# Patient Record
Sex: Female | Born: 1975
Health system: Southern US, Community
[De-identification: ages and names within clinical notes are randomized; demographics above are authoritative.]

## PROBLEM LIST (undated history)

## (undated) ENCOUNTER — Emergency Department (HOSPITAL_COMMUNITY): Admission: EM | Discharge status: Against Medical Advice | Payer: BC Managed Care – PPO

## (undated) DIAGNOSIS — F419 Anxiety disorder, unspecified: Secondary | ICD-10-CM

## (undated) DIAGNOSIS — F32A Depression, unspecified: Secondary | ICD-10-CM

## (undated) DIAGNOSIS — E785 Hyperlipidemia, unspecified: Secondary | ICD-10-CM

## (undated) DIAGNOSIS — D582 Other hemoglobinopathies: Secondary | ICD-10-CM

## (undated) DIAGNOSIS — F329 Major depressive disorder, single episode, unspecified: Secondary | ICD-10-CM

## (undated) HISTORY — PX: PELVIC LAPAROSCOPY: SHX162

## (undated) HISTORY — DX: Major depressive disorder, single episode, unspecified: F32.9

## (undated) HISTORY — DX: Depression, unspecified: F32.A

## (undated) HISTORY — DX: Hyperlipidemia, unspecified: E78.5

## (undated) HISTORY — PX: WISDOM TOOTH EXTRACTION: SHX21

## (undated) HISTORY — DX: Anxiety disorder, unspecified: F41.9

## (undated) HISTORY — DX: Other hemoglobinopathies: D58.2

---

## 1998-01-15 ENCOUNTER — Inpatient Hospital Stay (HOSPITAL_COMMUNITY): Admission: AD | Admit: 1998-01-15 | Discharge: 1998-01-15 | Payer: Self-pay | Admitting: Gynecology

## 1998-01-26 ENCOUNTER — Inpatient Hospital Stay (HOSPITAL_COMMUNITY): Admission: AD | Admit: 1998-01-26 | Discharge: 1998-01-26 | Payer: Self-pay | Admitting: Gynecology

## 1998-01-27 ENCOUNTER — Inpatient Hospital Stay (HOSPITAL_COMMUNITY): Admission: AD | Admit: 1998-01-27 | Discharge: 1998-01-27 | Payer: Self-pay | Admitting: Gynecology

## 1998-03-07 ENCOUNTER — Inpatient Hospital Stay (HOSPITAL_COMMUNITY): Admission: AD | Admit: 1998-03-07 | Discharge: 1998-03-09 | Payer: Self-pay | Admitting: Gynecology

## 1998-08-23 ENCOUNTER — Emergency Department (HOSPITAL_COMMUNITY): Admission: EM | Admit: 1998-08-23 | Discharge: 1998-08-23 | Payer: Self-pay | Admitting: Emergency Medicine

## 2000-07-29 ENCOUNTER — Encounter: Admission: RE | Admit: 2000-07-29 | Discharge: 2000-10-27 | Payer: Self-pay | Admitting: Gynecology

## 2000-09-08 ENCOUNTER — Inpatient Hospital Stay (HOSPITAL_COMMUNITY): Admission: AD | Admit: 2000-09-08 | Discharge: 2000-09-08 | Payer: Self-pay | Admitting: Gynecology

## 2000-11-16 ENCOUNTER — Inpatient Hospital Stay (HOSPITAL_COMMUNITY): Admission: AD | Admit: 2000-11-16 | Discharge: 2000-11-16 | Payer: Self-pay | Admitting: Gynecology

## 2000-11-25 ENCOUNTER — Inpatient Hospital Stay (HOSPITAL_COMMUNITY): Admission: AD | Admit: 2000-11-25 | Discharge: 2000-11-25 | Payer: Self-pay | Admitting: Gynecology

## 2001-01-07 ENCOUNTER — Inpatient Hospital Stay (HOSPITAL_COMMUNITY): Admission: AD | Admit: 2001-01-07 | Discharge: 2001-01-07 | Payer: Self-pay | Admitting: *Deleted

## 2001-01-12 ENCOUNTER — Inpatient Hospital Stay (HOSPITAL_COMMUNITY): Admission: AD | Admit: 2001-01-12 | Discharge: 2001-01-14 | Payer: Self-pay | Admitting: Gynecology

## 2002-08-01 ENCOUNTER — Encounter: Payer: Self-pay | Admitting: Emergency Medicine

## 2002-08-01 ENCOUNTER — Emergency Department (HOSPITAL_COMMUNITY): Admission: EM | Admit: 2002-08-01 | Discharge: 2002-08-01 | Payer: Self-pay | Admitting: Emergency Medicine

## 2003-05-18 ENCOUNTER — Other Ambulatory Visit: Admission: RE | Admit: 2003-05-18 | Discharge: 2003-05-18 | Payer: Self-pay | Admitting: Gynecology

## 2004-02-10 ENCOUNTER — Emergency Department (HOSPITAL_COMMUNITY): Admission: EM | Admit: 2004-02-10 | Discharge: 2004-02-10 | Payer: Self-pay | Admitting: *Deleted

## 2004-03-08 ENCOUNTER — Ambulatory Visit (HOSPITAL_BASED_OUTPATIENT_CLINIC_OR_DEPARTMENT_OTHER): Admission: RE | Admit: 2004-03-08 | Discharge: 2004-03-08 | Payer: Self-pay | Admitting: Gynecology

## 2004-10-03 ENCOUNTER — Other Ambulatory Visit: Admission: RE | Admit: 2004-10-03 | Discharge: 2004-10-03 | Payer: Self-pay | Admitting: Gynecology

## 2004-10-17 ENCOUNTER — Encounter: Admission: RE | Admit: 2004-10-17 | Discharge: 2005-01-15 | Payer: Self-pay | Admitting: Gynecology

## 2006-07-02 ENCOUNTER — Other Ambulatory Visit: Admission: RE | Admit: 2006-07-02 | Discharge: 2006-07-02 | Payer: Self-pay | Admitting: Gynecology

## 2006-08-19 ENCOUNTER — Ambulatory Visit: Payer: Self-pay | Admitting: Hematology & Oncology

## 2007-01-29 ENCOUNTER — Ambulatory Visit: Payer: Self-pay | Admitting: Oncology

## 2007-03-25 ENCOUNTER — Emergency Department (HOSPITAL_COMMUNITY): Admission: EM | Admit: 2007-03-25 | Discharge: 2007-03-25 | Payer: Self-pay | Admitting: Emergency Medicine

## 2007-07-22 ENCOUNTER — Other Ambulatory Visit: Admission: RE | Admit: 2007-07-22 | Discharge: 2007-07-22 | Payer: Self-pay | Admitting: Gynecology

## 2007-08-04 ENCOUNTER — Ambulatory Visit: Payer: Self-pay | Admitting: Hematology and Oncology

## 2007-08-17 LAB — CBC WITH DIFFERENTIAL/PLATELET
Basophils Absolute: 0.1 10*3/uL (ref 0.0–0.1)
EOS%: 1 % (ref 0.0–7.0)
Eosinophils Absolute: 0.1 10*3/uL (ref 0.0–0.5)
HCT: 37.1 % (ref 34.8–46.6)
HGB: 13.1 g/dL (ref 11.6–15.9)
MCH: 30.8 pg (ref 26.0–34.0)
MCV: 87.3 fL (ref 81.0–101.0)
NEUT#: 5.8 10*3/uL (ref 1.5–6.5)
NEUT%: 60.2 % (ref 39.6–76.8)
lymph#: 3.1 10*3/uL (ref 0.9–3.3)

## 2007-08-17 LAB — COMPREHENSIVE METABOLIC PANEL
AST: 13 U/L (ref 0–37)
Albumin: 4.3 g/dL (ref 3.5–5.2)
BUN: 12 mg/dL (ref 6–23)
Calcium: 9.5 mg/dL (ref 8.4–10.5)
Chloride: 107 mEq/L (ref 96–112)
Creatinine, Ser: 0.6 mg/dL (ref 0.40–1.20)
Glucose, Bld: 87 mg/dL (ref 70–99)

## 2007-08-17 LAB — LACTATE DEHYDROGENASE: LDH: 100 U/L (ref 94–250)

## 2007-11-24 ENCOUNTER — Emergency Department (HOSPITAL_COMMUNITY): Admission: EM | Admit: 2007-11-24 | Discharge: 2007-11-24 | Payer: Self-pay | Admitting: Family Medicine

## 2009-07-26 ENCOUNTER — Other Ambulatory Visit: Admission: RE | Admit: 2009-07-26 | Discharge: 2009-07-26 | Payer: Self-pay | Admitting: Gynecology

## 2009-07-26 ENCOUNTER — Encounter: Payer: Self-pay | Admitting: Gynecology

## 2009-07-26 ENCOUNTER — Ambulatory Visit: Payer: Self-pay | Admitting: Gynecology

## 2009-09-06 ENCOUNTER — Ambulatory Visit: Payer: Self-pay | Admitting: Gynecology

## 2010-10-21 ENCOUNTER — Other Ambulatory Visit: Payer: Self-pay | Admitting: Gynecology

## 2010-10-21 ENCOUNTER — Other Ambulatory Visit (HOSPITAL_COMMUNITY)
Admission: RE | Admit: 2010-10-21 | Discharge: 2010-10-21 | Disposition: A | Discharge status: Home / Self Care | Payer: BC Managed Care – PPO | Source: Ambulatory Visit | Attending: Gynecology | Admitting: Gynecology

## 2010-10-21 ENCOUNTER — Ambulatory Visit
Admission: RE | Admit: 2010-10-21 | Discharge: 2010-10-21 | Payer: Self-pay | Source: Home / Self Care | Attending: Gynecology | Admitting: Gynecology

## 2010-10-21 DIAGNOSIS — Z124 Encounter for screening for malignant neoplasm of cervix: Secondary | ICD-10-CM | POA: Insufficient documentation

## 2010-10-21 DIAGNOSIS — R87810 Cervical high risk human papillomavirus (HPV) DNA test positive: Secondary | ICD-10-CM | POA: Insufficient documentation

## 2010-10-28 ENCOUNTER — Other Ambulatory Visit (INDEPENDENT_AMBULATORY_CARE_PROVIDER_SITE_OTHER): Payer: BC Managed Care – PPO

## 2010-10-28 ENCOUNTER — Ambulatory Visit: Payer: BC Managed Care – PPO | Admitting: Gynecology

## 2010-10-28 DIAGNOSIS — N831 Corpus luteum cyst of ovary, unspecified side: Secondary | ICD-10-CM

## 2010-10-28 DIAGNOSIS — E7800 Pure hypercholesterolemia, unspecified: Secondary | ICD-10-CM

## 2010-10-28 DIAGNOSIS — N920 Excessive and frequent menstruation with regular cycle: Secondary | ICD-10-CM

## 2010-10-28 DIAGNOSIS — N946 Dysmenorrhea, unspecified: Secondary | ICD-10-CM

## 2010-11-14 ENCOUNTER — Other Ambulatory Visit: Payer: Self-pay | Admitting: Gynecology

## 2010-11-14 ENCOUNTER — Ambulatory Visit (INDEPENDENT_AMBULATORY_CARE_PROVIDER_SITE_OTHER): Payer: BC Managed Care – PPO | Admitting: Gynecology

## 2010-11-14 DIAGNOSIS — N870 Mild cervical dysplasia: Secondary | ICD-10-CM

## 2010-11-21 ENCOUNTER — Institutional Professional Consult (permissible substitution) (INDEPENDENT_AMBULATORY_CARE_PROVIDER_SITE_OTHER): Payer: BC Managed Care – PPO | Admitting: Gynecology

## 2010-11-21 DIAGNOSIS — N870 Mild cervical dysplasia: Secondary | ICD-10-CM

## 2011-02-07 NOTE — H&P (Signed)
NAME:  Linda Winters, Linda Winters                    ACCOUNT NO.:  [id number]   MEDICAL RECORD NO.:  [id number]                   PATIENT TYPE:  AMB   LOCATION:  [facility]                                 FACILITY:  [facility]   PHYSICIAN:  Juan H. Lily Peer, M.D.             DATE OF BIRTH:  [birth year]   DATE OF ADMISSION:  03/08/2004  DATE OF DISCHARGE:                                HISTORY & PHYSICAL   CHIEF COMPLAINT:  Chronic left lower quadrant pain.   HISTORY:  The patient is a 35 year old gravida 3, para 2, AB 1, who has  continued to complain of left lower quadrant pain.  She is currently on  Ortho Evra as a form of contraception she takes continuously for six weeks  and then withdraws.  She also takes Sarafem 10 mg daily for PMDD.  Review of  her records indicated in 1996 she had a diagnostic laparoscopy and ablation  of endometriotic implants in the area of the cul-de-sac.  Despite her being  on the Ortho Evra patch, she continues to have the pain.  More than likely  she is having flare-ups of endometriosis.  I have given her the option of  proceeding with re-laparoscopy and treating with laser with follow-up as  oral contraceptive procedures, but she has opted to proceed with this.  She  was seen in the office on June 13 for an ultrasound.  The ultrasound  demonstrated a normal-sized uterus with normal ovaries.  The patient will  proceed with a diagnostic laparoscopy this coming Friday.   PAST MEDICAL HISTORY:  She has had two normal spontaneous vaginal  deliveries.  She has had laparoscopy with a diagnosis of endometriosis in  the cul-de-sac.  She has also had the NuvaRing and has been on the Ortho  Evra patch.   Medical history:  She has a history of hemoglobin C trait (husband  hemoglobin S).   FAMILY HISTORY:  Father with noninsulin-dependent diabetes as well as  hypertension.   PHYSICAL EXAMINATION:  VITAL SIGNS:  The patient weighs approximately 170  pounds, 5 feet 7 inches  tall.  Blood pressure 108/80.  HEENT:  Unremarkable.  NECK:  Supple.  Trachea midline.  No carotid bruits.  No thyromegaly.  CHEST:  Lungs clear to auscultation without rhonchi or wheezes.  CARDIAC:  Regular rate and rhythm, no murmur or gallop.  BREASTS:  Exam not done.  ABDOMEN:  Soft, nontender, no rebound or guarding.  PELVIC:  Bartholin's, urethra, Skene's glands within normal limits.  Vagina:  No gross lesion on inspection.  Bimanual exam unremarkable.  Rectal exam  unremarkable.   ASSESSMENT:  A 35 year old gravida 3, para 2, abortus 1, with chronic left  lower quadrant pain, prior history of endometriosis of the cul-de-sac in  1996.  Will proceed with a diagnostic laparoscopy to rule out flare-up of  endometriosis.  Risks and benefits and pros and cons from laparoscopy and  laser were discussed to  include infection, bleeding, trauma to internal  organs requiring open laparotomy and entrance to the abdominal cavity for  repair of any damage or areas unaccessible with the laparoscope, which would  then require her to be in the hospital for an extra few days.  All these  issues were discussed with the patient.  Also, the remote possibility of  hemorrhage; if she were to need any blood or blood products there is a risk  from blood transfusion to include anaphylactic reaction, hepatitis, or  acquired immune deficiency syndrome.  Also there is the risk of deep venous  thrombosis and pulmonary embolism as well as infection, although she will  receive prophylactic antibiotic as well.  The patient was counseled and  accepts all of the above risks.   PLAN:  The patient is scheduled for diagnostic laparoscopy on Friday, June  17, at 9 a.m. at [facility].  Please have history and  physical available.                                               Juan H. Lily Peer, M.D.    JHF/MEDQ  D:  03/05/2004  T:  03/05/2004  Job:  [id number]

## 2011-02-07 NOTE — Op Note (Signed)
NAME:  Linda Winters, Linda Winters                         ACCOUNT NO.:  [id number]   MEDICAL RECORD NO.:  [id number]                   PATIENT TYPE:  AMB   LOCATION:  [facility]                                 FACILITY:  [facility]   PHYSICIAN:  Juan H. Lily Peer, M.D.             DATE OF BIRTH:  [birth year]   DATE OF PROCEDURE:  03/08/2004  DATE OF DISCHARGE:                                 OPERATIVE REPORT   INDICATION FOR OPERATION:  A 35 year old gravida 3, para 2, AB 1 with  chronic lower abdominal pain, mostly in the left lower quadrant.  Patient  with prior history of endometriosis in 1996, treated by laser laparoscopy.   SURGEON:  Juan H. Lily Peer, M.D.   PREOPERATIVE DIAGNOSIS:  Chronic left lower abdominal pains.   POSTOPERATIVE DIAGNOSIS:  Chronic left lower abdominal pains.   ANESTHESIA:  General endotracheal.   PROCEDURE PERFORMED:  Diagnostic laparoscopy.   FINDINGS:  Normal tubes, uterus, and ovaries.  Lush fimbriae of both  fallopian tubes.  Anterior and posterior cul-de-sac with no adhesions or  endometriotic implants.  A small masters window was noted.  Otherwise,  normal cul-de-sac.  The liver surface was smooth, as was part of the  external surface of the gallbladder that was visualized.  The appendix  appeared grossly normal as well.   DESCRIPTION OF OPERATION:  After the patient was adequately counseled, she  was taken to the operating room where she underwent a successful general  endotracheal anesthesia.  She had received 1 g of Cefotan prophylactically.  She was placed in low lithotomy position.  The abdomen and vagina were  prepped and draped in the usual sterile fashion.  A Foley catheter was  introduced to evacuate the bladder of its contents for approximately 50 mL.  Examination under anesthesia demonstrated an anteverted, normal-sized,  shape, and consistently-sized uterus with no palpable adnexal masses.  A  Hulka tenaculum was placed for manipulation during the  laparoscopic  procedure.  Once the drapes were in place, a small stab incision was made in  the infraumbilical region followed by insertion of the Veress needle.  Open  intra-abdominal pressure was recorded at 5 mmHg, and approximately 2.5 L of  carbon dioxide were insufflated into the peritoneal cavity.  The Veress  needle was removed.  The 10 mm trocar was inserted.  The sleeve was left in  place; the trocar was removed, and the laparoscope was inserted.  The second  puncture site was made 2 cm above the symphysis pubis whereby a 5 mm trocar  was inserted under laparoscopic guidance.  In a systematic fashion, the  anterior and posterior cul-de-sac were inspected with no lesions noted with  the exception of a small masters window in the cul-de-sac.  Both tubes were  healthy with lush fimbriated end.  Both ovaries were completely normal in  appearance and in size.  The undersurfaces were inspected as well, and no  evidence of any recurrent endometriosis.  The appendix appeared grossly  normal.  The liver surface was smooth, and part of the gallbladder surface  was visualized and appeared normal as well.  The carbon dioxide was removed  from the peritoneal cavity.  The subumbilical fascia was approximated with a  figure-of-eight of 3-0 Vicryl suture, and the skin was reapproximated  subcuticular stitch of 4-0 plain catgut suture.  The 5 mm trocar site was  reapproximated with interrupted sutures of 4-0 plain catgut suture.  For  postoperative analgesia, 0.25% Marcaine was infiltrated in both incisions  sites for a total of 10 mL.  The Hulka tenaculum was removed.  Silver  nitrate was placed into the anterior cervical lip where it was bleeding a  little bit from the tenaculum.  Once hemostasis was reassured, the patient  was extubated and transferred to the recovery room with stable vital signs.  Blood loss was  minimal, and fluid resuscitation consisted of 80 mL of lactated Ringer's.  The  patient will be referred to gastroenterology after she is seen for a  postop visit in 2 weeks for further management of her lower abdominal pains.                                               Juan H. Lily Peer, M.D.    JHF/MEDQ  D:  03/08/2004  T:  03/08/2004  Job:  [id number]

## 2011-02-07 NOTE — Discharge Summary (Signed)
[facility]  Patient:    Linda Winters, Linda Winters                 MRN: [id number] Adm. Date:  [id number] Disc. Date: [id number] Attending:  Tonye Royalty Dictator:   Antony Contras, N.P.                           Discharge Summary  DISCHARGE DIAGNOSES:          Intrauterine pregnancy at term, history of positive GBS with previous pregnancy, spontaneous onset of labor.  PROCEDURE:                    Normal spontaneous vaginal delivery of viable infant over a midline episiotomy.  HOSPITAL COURSE:              Patient is a 35 year old gravida 3, para 1-0-1-1 with an EDC of January 15, 2001 per sonogram.  Prenatal risk factors include a history of positive beta strep with a previous pregnancy.  PRENATAL LABORATORIES:        Blood type B-.  Antibody screen negative. Sickle cell negative.  RPR, HBSAG, HIV nonreactive.  Rubella immune.  MSAFP normal.  HOSPITAL COURSE:              Patient was admitted on January 12, 2001 with spontaneous onset of labor.  On admission the cervix was 4 cm, 50%, -2. Artificial rupture of membranes revealed clear fluid.  Patient progressed quickly to complete dilatation and delivered an Apgar 8/9 female weighing 8 pounds 12 ounces over a midline episiotomy.  Postpartum course:  She remained afebrile.  Had no difficulty voiding and was able to be discharged in satisfactory condition on her second postpartum day.  The baby was found to be B+ and RhoGAM was administered prior to discharge.  LABORATORIES:                 CBC:  Hematocrit 37.1, hemoglobin 12.9, WBC 20.1, platelets 249,000.  DISPOSITION:                  Follow-up in six weeks.  Continue with prenatal vitamins and iron.  Motrin and Tylox for pain. DD:  02/01/01 TD:  02/01/01 Job: [id number]

## 2011-06-11 ENCOUNTER — Encounter: Payer: Self-pay | Admitting: Anesthesiology

## 2011-06-16 LAB — INFLUENZA A AND B ANTIGEN (CONVERTED LAB)
Inflenza A Ag: NEGATIVE
Influenza B Ag: NEGATIVE

## 2011-06-17 ENCOUNTER — Encounter: Payer: Self-pay | Admitting: Gynecology

## 2011-06-17 ENCOUNTER — Other Ambulatory Visit (HOSPITAL_COMMUNITY)
Admission: RE | Admit: 2011-06-17 | Discharge: 2011-06-17 | Disposition: A | Discharge status: Home / Self Care | Payer: BC Managed Care – PPO | Source: Ambulatory Visit | Attending: Gynecology | Admitting: Gynecology

## 2011-06-17 ENCOUNTER — Ambulatory Visit (INDEPENDENT_AMBULATORY_CARE_PROVIDER_SITE_OTHER): Payer: BC Managed Care – PPO | Admitting: Gynecology

## 2011-06-17 DIAGNOSIS — R87619 Unspecified abnormal cytological findings in specimens from cervix uteri: Secondary | ICD-10-CM

## 2011-06-17 DIAGNOSIS — N809 Endometriosis, unspecified: Secondary | ICD-10-CM

## 2011-06-17 DIAGNOSIS — N870 Mild cervical dysplasia: Secondary | ICD-10-CM

## 2011-06-17 DIAGNOSIS — Z01419 Encounter for gynecological examination (general) (routine) without abnormal findings: Secondary | ICD-10-CM | POA: Insufficient documentation

## 2011-06-17 NOTE — Progress Notes (Signed)
Patient is a 35 year old gravida 3 para 2 AB 1 using condoms for contraception presented to the office for her followup Pap smear. Patient had been seen on 12/01/2010. Patient had an abnormal Pap smear time of her annual exam in January 13 which demonstrated low-grade squamous intraepithelial lesion with high-risk HPV. Patient's previous Pap smear had all been normal in the past. Patient monogamous relationship. Patient underwent colposcopic evaluation on  February 23rd 2012 whereby CIN-1 was noted at the 11:00 position of the ectocervix and had benign ECC. The new guidelines by the American Society  ofColposcopy and CervicalPathology were discussed with the patient and recommended expectant management of women such as herself with biopsy confirming CIN-1 with full visualization of the lesion and expected compliance for followup was given the option of HPV testing at 12 months or a repeat cytology in 6-12 months. She decided to followup with her Pap smear 6 months. Her Pap smear was repeated today and she scheduled to return back in 6 months for a full annual gynecological examination.

## 2011-06-17 NOTE — Patient Instructions (Signed)
Need complete annual exam in six months. Congratulations on our smoking cessation! I knew you could do it.

## 2011-07-07 ENCOUNTER — Telehealth: Payer: Self-pay | Admitting: *Deleted

## 2011-07-07 NOTE — Telephone Encounter (Signed)
Pt called wanting recent pap results, results given to pt.

## 2011-07-08 LAB — POCT CARDIAC MARKERS
CKMB, poc: <1 — ABNORMAL LOW
CKMB, poc: <1 — ABNORMAL LOW
Myoglobin, poc: 43.7
Operator id: 234111
Operator id: 270651
Troponin i, poc: <0.05

## 2011-07-08 LAB — I-STAT 8, (EC8 V) (CONVERTED LAB)
Bicarbonate: 25 — ABNORMAL HIGH
Glucose, Bld: 86
Hemoglobin: 14.6
Operator id: 234111
Sodium: 139
TCO2: 26

## 2011-07-08 LAB — DIFFERENTIAL
Eosinophils Relative: 1
Lymphocytes Relative: 22
Lymphs Abs: 3.3
Monocytes Absolute: 0.8 — ABNORMAL HIGH

## 2011-07-08 LAB — CBC
Hemoglobin: 13.4
MCHC: 33.9
Platelets: 321
RDW: 13

## 2011-07-08 LAB — POCT I-STAT CREATININE: Operator id: 234111

## 2012-01-08 ENCOUNTER — Encounter: Payer: BC Managed Care – PPO | Admitting: Gynecology

## 2012-01-15 ENCOUNTER — Telehealth: Payer: Self-pay | Admitting: *Deleted

## 2012-01-15 NOTE — Telephone Encounter (Signed)
Pt called c/o yeast infection requesting rx, spoke with pt and told her OV best, pt will check schedule and call back to schedule.

## 2012-03-31 ENCOUNTER — Encounter: Payer: BC Managed Care – PPO | Admitting: Gynecology

## 2012-06-08 ENCOUNTER — Encounter: Payer: Self-pay | Admitting: Gynecology

## 2012-06-08 ENCOUNTER — Other Ambulatory Visit (HOSPITAL_COMMUNITY)
Admission: RE | Admit: 2012-06-08 | Discharge: 2012-06-08 | Disposition: A | Discharge status: Home / Self Care | Payer: BC Managed Care – PPO | Source: Ambulatory Visit | Attending: Gynecology | Admitting: Gynecology

## 2012-06-08 ENCOUNTER — Ambulatory Visit (INDEPENDENT_AMBULATORY_CARE_PROVIDER_SITE_OTHER): Payer: BC Managed Care – PPO | Admitting: Gynecology

## 2012-06-08 VITALS — BP 126/80 | Ht 67.0 in | Wt 217.0 lb

## 2012-06-08 DIAGNOSIS — Z833 Family history of diabetes mellitus: Secondary | ICD-10-CM

## 2012-06-08 DIAGNOSIS — Z01419 Encounter for gynecological examination (general) (routine) without abnormal findings: Secondary | ICD-10-CM | POA: Insufficient documentation

## 2012-06-08 DIAGNOSIS — R635 Abnormal weight gain: Secondary | ICD-10-CM

## 2012-06-08 DIAGNOSIS — Z23 Encounter for immunization: Secondary | ICD-10-CM

## 2012-06-08 DIAGNOSIS — N951 Menopausal and female climacteric states: Secondary | ICD-10-CM

## 2012-06-08 DIAGNOSIS — R232 Flushing: Secondary | ICD-10-CM

## 2012-06-08 LAB — CBC WITH DIFFERENTIAL/PLATELET
Basophils Absolute: 0 10*3/uL (ref 0.0–0.1)
Basophils Relative: 0 % (ref 0–1)
Eosinophils Absolute: 0.1 10*3/uL (ref 0.0–0.7)
Lymphs Abs: 4.4 10*3/uL — ABNORMAL HIGH (ref 0.7–4.0)
MCH: 29 pg (ref 26.0–34.0)
MCHC: 35 g/dL (ref 30.0–36.0)
Neutrophils Relative %: 59 % (ref 43–77)
Platelets: 369 10*3/uL (ref 150–400)
RBC: 4.62 MIL/uL (ref 3.87–5.11)
RDW: 13.5 % (ref 11.5–15.5)

## 2012-06-08 NOTE — Progress Notes (Signed)
Linda Winters Dec 09, 1975 [id number]   History:    36 y.o.  for annual gyn exam who was only complaint is bilateral breast tenderness on the outer quadrant for the past several weeks. Patient denies any recent injury. Patient denies any nipple discharge. No family history of breast cancer reported. Patient is not using any form of contraception except withdrawal. Review of her record again she was weighing 198 is up to 217. She does not recall when she ever had the dTap Vaccine. She occasionally will complaints of hot flashes. Her father was non-insulin-dependent diabetic. Patient has stopped smoking for the past year.  Patient had been seen on 12/01/2010. Patient had an abnormal Pap smear time of her annual exam in January 13 which demonstrated low-grade squamous intraepithelial lesion with high-risk HPV. Patient's previous Pap smear had all been normal in the past. Patient monogamous relationship. Patient underwent colposcopic evaluation on February 23rd 2012 whereby CIN-1 was noted at the 11:00 position of the ectocervix and had benign ECC. The new guidelines by the American Society ofColposcopy and CervicalPathology were discussed with the patient and recommended expectant management of women such as herself with biopsy confirming CIN-1 with full visualization of the lesion and expected compliance for followup was given the option of HPV testing at 12 months or a repeat cytology in 6-12 months. She decided to followup with her Pap smear.    Past medical history,surgical history, family history and social history were all reviewed and documented in the EPIC chart.  Gynecologic History Patient's last menstrual period was 05/13/2012. Contraception: Withdrawal Last Pap: 2012. Results were: normal Last mammogram: No prior study. Results were: No prior study  Obstetric History OB History    Grav Para Term Preterm Abortions TAB SAB Ect Mult Living   3 2 2  1  1   2      # Outc Date GA Lbr Len/2nd Wgt  Sex Del Anes PTL Lv   1 TRM     F SVD  No Yes   2 TRM     M SVD  No Yes   3 SAB                ROS: A ROS was performed and pertinent positives and negatives are included in the history.  GENERAL: No fevers or chills. HEENT: No change in vision, no earache, sore throat or sinus congestion. NECK: No pain or stiffness. CARDIOVASCULAR: No chest pain or pressure. No palpitations. PULMONARY: No shortness of breath, cough or wheeze. GASTROINTESTINAL: No abdominal pain, nausea, vomiting or diarrhea, melena or bright red blood per rectum. GENITOURINARY: No urinary frequency, urgency, hesitancy or dysuria. MUSCULOSKELETAL: No joint or muscle pain, no back pain, no recent trauma. DERMATOLOGIC: No rash, no itching, no lesions. ENDOCRINE: No polyuria, polydipsia, no heat or cold intolerance. No recent change in weight. HEMATOLOGICAL: No anemia or easy bruising or bleeding. NEUROLOGIC: No headache, seizures, numbness, tingling or weakness. PSYCHIATRIC: No depression, no loss of interest in normal activity or change in sleep pattern.     Exam: chaperone present  BP 126/80  Ht 5\' 7"  (1.702 m)  Wt 217 lb (98.431 kg)  BMI 33.99 kg/m2  LMP 05/13/2012  Body mass index is 33.99 kg/(m^2).  General appearance : Well developed well nourished female. No acute distress HEENT: Neck supple, trachea midline, no carotid bruits, no thyroidmegaly Lungs: Clear to auscultation, no rhonchi or wheezes, or rib retractions  Heart: Regular rate and rhythm, no murmurs or gallops Breast:Examined in sitting  and supine position were symmetrical in appearance, no palpable masses or tenderness,  no skin retraction, no nipple inversion, no nipple discharge, no skin discoloration, no axillary or supraclavicular lymphadenopathy Abdomen: no palpable masses or tenderness, no rebound or guarding Extremities: no edema or skin discoloration or tenderness  Pelvic:  Bartholin, Urethra, Skene Glands: Within normal limits              Vagina: No gross lesions or discharge  Cervix: No gross lesions or discharge  Uterus  anteverted, normal size, shape and consistency, non-tender and mobile  Adnexa  Without masses or tenderness  Anus and perineum  normal   Rectovaginal  normal sphincter tone without palpated masses or tenderness             Hemoccult not done     Assessment/Plan:  36 y.o. female for annual exam with complaint of bilateral mastodynia in the upper-outer quadrant both breasts. Her examination no palpable masses or tenderness were elicited. Because of her weight and to rule out any possibility of some lesion deeper than was not palpated she will be sent for bilateral diagnostic mammogram. The following labs were ordered today: TSH, FSH, CBC, screening cholesterol, urinalysis, hemoglobin A1c and Pap smear. Literature information on diet and exercise was provided. Patient was to receive the dTap Vaccine today.    Ok Edwards MD, 3:25 PM 06/08/2012

## 2012-06-08 NOTE — Patient Instructions (Addendum)
Cholesterol Control Diet  Cholesterol levels in your body are determined significantly by your diet. Cholesterol levels may also be related to heart disease. The following material helps to explain this relationship and discusses what you can do to help keep your heart healthy. Not all cholesterol is bad. Low-density lipoprotein (LDL) cholesterol is the "bad" cholesterol. It may cause fatty deposits to build up inside your arteries. High-density lipoprotein (HDL) cholesterol is "good." It helps to remove the "bad" LDL cholesterol from your blood. Cholesterol is a very important risk factor for heart disease. Other risk factors are high blood pressure, smoking, stress, heredity, and weight. The heart muscle gets its supply of blood through the coronary arteries. If your LDL cholesterol is high and your HDL cholesterol is low, you are at risk for having fatty deposits build up in your coronary arteries. This leaves less room through which blood can flow. Without sufficient blood and oxygen, the heart muscle cannot function properly and you may feel chest pains (angina pectoris). When a coronary artery closes up entirely, a part of the heart muscle may die, causing a heart attack (myocardial infarction). CHECKING CHOLESTEROL When your caregiver sends your blood to a lab to be analyzed for cholesterol, a complete lipid (fat) profile may be done. With this test, the total amount of cholesterol and levels of LDL and HDL are determined. Triglycerides are a type of fat that circulates in the blood and can also be used to determine heart disease risk. The list below describes what the numbers should be: Test: Total Cholesterol.  Less than 200 mg/dl.  Test: LDL "bad cholesterol."  Less than 100 mg/dl.   Less than 70 mg/dl if you are at very high risk of a heart attack or sudden cardiac death.  Test: HDL "good cholesterol."  Greater than 50 mg/dl for women.    Greater than 40 mg/dl for men.  Test: Triglycerides.  Less than 150 mg/dl.  CONTROLLING CHOLESTEROL WITH DIET Although exercise and lifestyle factors are important, your diet is key. That is because certain foods are known to raise cholesterol and others to lower it. The goal is to balance foods for their effect on cholesterol and more importantly, to replace saturated and trans fat with other types of fat, such as monounsaturated fat, polyunsaturated fat, and omega-3 fatty acids. On average, a person should consume no more than 15 to 17 g of saturated fat daily. Saturated and trans fats are considered "bad" fats, and they will raise LDL cholesterol. Saturated fats are primarily found in animal products such as meats, butter, and cream. However, that does not mean you need to sacrifice all your favorite foods. Today, there are good tasting, low-fat, low-cholesterol substitutes for most of the things you like to eat. Choose low-fat or nonfat alternatives. Choose round or loin cuts of red meat, since these types of cuts are lowest in fat and cholesterol. Chicken (without the skin), fish, veal, and ground [place] breast are excellent choices. Eliminate fatty meats, such as hot dogs and salami. Even shellfish have little or no saturated fat. Have a 3 oz (85 g) portion when you eat lean meat, poultry, or fish. Trans fats are also called "partially hydrogenated oils." They are oils that have been scientifically manipulated so that they are solid at room temperature resulting in a longer shelf life and improved taste and texture of foods in which they are added. Trans fats are found in stick margarine, some tub margarines, cookies, crackers, and baked goods.  When baking and cooking, oils are an excellent substitute for butter. The monounsaturated oils are especially beneficial since it is believed they lower LDL and raise HDL. The oils you should avoid entirely are saturated tropical oils, such as coconut and  palm.  Remember to eat liberally from food groups that are naturally free of saturated and trans fat, including fish, fruit, vegetables, beans, grains (barley, rice, couscous, bulgur wheat), and pasta (without cream sauces).  IDENTIFYING FOODS THAT LOWER CHOLESTEROL  Soluble fiber may lower your cholesterol. This type of fiber is found in fruits such as apples, vegetables such as broccoli, potatoes, and carrots, legumes such as beans, peas, and lentils, and grains such as barley. Foods fortified with plant sterols (phytosterol) may also lower cholesterol. You should eat at least 2 g per day of these foods for a cholesterol lowering effect.  Read package labels to identify low-saturated fats, trans fats free, and low-fat foods at the supermarket. Select cheeses that have only 2 to 3 g saturated fat per ounce. Use a heart-healthy tub margarine that is free of trans fats or partially hydrogenated oil. When buying baked goods (cookies, crackers), avoid partially hydrogenated oils. Breads and muffins should be made from whole grains (whole-wheat or whole oat flour, instead of "flour" or "enriched flour"). Buy non-creamy canned soups with reduced salt and no added fats.  FOOD PREPARATION TECHNIQUES  Never deep-fry. If you must fry, either stir-fry, which uses very little fat, or use non-stick cooking sprays. When possible, broil, bake, or roast meats, and steam vegetables. Instead of dressing vegetables with butter or margarine, use lemon and herbs, applesauce and cinnamon (for squash and sweet potatoes), nonfat yogurt, salsa, and low-fat dressings for salads.  LOW-SATURATED FAT / LOW-FAT FOOD SUBSTITUTES Meats / Saturated Fat (g)  Avoid: Steak, marbled (3 oz/85 g) / 11 g   Choose: Steak, lean (3 oz/85 g) / 4 g   Avoid: Hamburger (3 oz/85 g) / 7 g   Choose: Hamburger, lean (3 oz/85 g) / 5 g   Avoid: Ham (3 oz/85 g) / 6 g   Choose: Ham, lean cut (3 oz/85 g) / 2.4 g   Avoid: Chicken, with skin, dark  meat (3 oz/85 g) / 4 g   Choose: Chicken, skin removed, dark meat (3 oz/85 g) / 2 g   Avoid: Chicken, with skin, light meat (3 oz/85 g) / 2.5 g   Choose: Chicken, skin removed, light meat (3 oz/85 g) / 1 g  Dairy / Saturated Fat (g)  Avoid: Whole milk (1 cup) / 5 g   Choose: Low-fat milk, 2% (1 cup) / 3 g   Choose: Low-fat milk, 1% (1 cup) / 1.5 g   Choose: Skim milk (1 cup) / 0.3 g   Avoid: Hard cheese (1 oz/28 g) / 6 g   Choose: Skim milk cheese (1 oz/28 g) / 2 to 3 g   Avoid: Cottage cheese, 4% fat (1 cup) / 6.5 g   Choose: Low-fat cottage cheese, 1% fat (1 cup) / 1.5 g   Avoid: Ice cream (1 cup) / 9 g   Choose: Sherbet (1 cup) / 2.5 g   Choose: Nonfat frozen yogurt (1 cup) / 0.3 g   Choose: Frozen fruit bar / trace   Avoid: Whipped cream (1 tbs) / 3.5 g   Choose: Nondairy whipped topping (1 tbs) / 1 g  Condiments / Saturated Fat (g)  Avoid: Mayonnaise (1 tbs) / 2 g   Choose: Low-fat  mayonnaise (1 tbs) / 1 g   Avoid: Butter (1 tbs) / 7 g   Choose: Extra light margarine (1 tbs) / 1 g   Avoid: Coconut oil (1 tbs) / 11.8 g   Choose: Olive oil (1 tbs) / 1.8 g   Choose: Corn oil (1 tbs) / 1.7 g   Choose: Safflower oil (1 tbs) / 1.2 g   Choose: Sunflower oil (1 tbs) / 1.4 g   Choose: Soybean oil (1 tbs) / 2.4 g   Choose: Canola oil (1 tbs) / 1 g  Document Released: 09/08/2005 Document Revised: 05/21/2011 Document Reviewed: 02/27/2011 [facility] Patient Information 2012 [place], [place].  Exercise to Lose Weight Exercise and a healthy diet may help you lose weight. Your doctor may suggest specific exercises. EXERCISE IDEAS AND TIPS  Choose low-cost things you enjoy doing, such as walking, bicycling, or exercising to workout videos.   Take stairs instead of the elevator.   Walk during your lunch break.   Park your car further away from work or school.   Go to a gym or an exercise class.   Start with 5 to 10 minutes of exercise each day. Build up to  30 minutes of exercise 4 to 6 days a week.   Wear shoes with good support and comfortable clothes.   Stretch before and after working out.   Work out until you breathe harder and your heart beats faster.   Drink extra water when you exercise.   Do not do so much that you hurt yourself, feel dizzy, or get very short of breath.  Exercises that burn about 150 calories:  Running 1  miles in 15 minutes.   Playing volleyball for 45 to 60 minutes.   Washing and waxing a car for 45 to 60 minutes.   Playing touch football for 45 minutes.   Walking 1  miles in 35 minutes.   Pushing a stroller 1  miles in 30 minutes.   Playing basketball for 30 minutes.   Raking leaves for 30 minutes.   Bicycling 5 miles in 30 minutes.   Walking 2 miles in 30 minutes.   Dancing for 30 minutes.   Shoveling snow for 15 minutes.   Swimming laps for 20 minutes.   Walking up stairs for 15 minutes.   Bicycling 4 miles in 15 minutes.   Gardening for 30 to 45 minutes.   Jumping rope for 15 minutes.   Washing windows or floors for 45 to 60 minutes.  Document Released: 10/11/2010 Document Revised: 05/21/2011 Document Reviewed: 10/11/2010 [facility] Patient Information 2012 Exit Care, [facility].   Diphtheria, Tetanus, and Pertussis (DTaP) Vaccine What You Need to Know WHY GET VACCINATED? Diphtheria, tetanus, and pertussis are serious diseases caused by bacteria. Diphtheria and pertussis are spread from person to person. Tetanus enters the body through cuts or wounds. Diphtheria causes a thick covering in the back of the throat.  It can lead to breathing problems, paralysis, heart failure, and even death.  Tetanus (Lockjaw) causes painful tightening of the muscles, usually all over the body.  It can lead to "locking" of the jaw so the victim cannot open his or her mouth or swallow. Tetanus leads to death in about 2 out of 10 cases.  Pertussis (Whooping Cough) causes coughing spells so bad that it  is hard for infants to eat, drink, or breathe. These spells can last for weeks.  It can lead to pneumonia, seizures (jerking and staring spells), brain damage, and death.  Diphtheria, tetanus, and pertussis vaccine (DTaP) can help prevent these diseases. Most children who are vaccinated with DTaP will be protected throughout childhood. Many more children would get these diseases if we stopped vaccinating. DTaP is a safer version of an older vaccine called DTP. DTP is no longer used in the [place]. WHO SHOULD GET DTAP VACCINE AND WHEN? Children should get 5 doses of DTaP vaccine, 1 dose at each of the following ages:  2 months.   4 months.   6 months.   15 to 18 months.   4 to 6 years.  DTaP may be given at the same time as other vaccines. SOME CHILDREN SHOULD NOT GET DTAP VACCINE OR SHOULD WAIT  Children with minor illnesses, such as a cold, may be vaccinated. But children who are moderately or severely ill should usually wait until they recover before getting DTaP vaccine.   Any child who had a life-threatening allergic reaction after a dose of DTaP should not get another dose.   Any child who suffered a brain or nervous system disease within 7 days after a dose of DTaP should not get another dose.   Talk with your caregiver if your child:   Had a seizure or collapsed after a dose of DTaP.   Cried non-stop for 3 hours or more after a dose of DTaP.   Had a fever over 105 F (40.6 C) after a dose of DTaP.   Ask your caregiver for more information. Some of these children should not get another dose of pertussis vaccine, but may get a vaccine without pertussis, called DT.  OLDER CHILDREN AND ADULTS  DTaP is not licensed for adolescents, adults, or children 104 years of age and older.   But older people still need protection. A vaccine called Tdap is similar to DTaP. A single dose of Tdap is recommended for people 11 through 35 years of age. Another vaccine, called Td,  protects against tetanus and diphtheria, but not pertussis. It is recommended every 10 years.  WHAT ARE THE RISKS FROM DTAP VACCINE?  Getting diphtheria, tetanus, or pertussis disease is much riskier than getting DTaP vaccine.   However, a vaccine, like any medicine, is capable of causing serious problems, such as severe allergic reactions. The risk of DTaP vaccine causing serious harm, or death, is extremely small.  Mild Problems (Common)  Fever (up to about 1 child in 4).   Redness or swelling where the shot was given (up to about 1 child in 4).   Soreness or tenderness where the shot was given (up to about 1 child in 4).  These problems occur more often after the 4th and 5th doses of the DTaP series than after earlier doses. Sometimes the 4th or 5th dose of DTaP vaccine is followed by swelling of the entire arm or leg in which the shot was given, lasting 1 to 7 days (up to about 1 child in 30). Other mild problems include:  Fussiness (up to about 1 child in 3).   Tiredness or poor appetite (up to about 1 child in 10).   Vomiting (up to about 1 child in 50).  These problems generally occur 1 to 3 days after the shot. Moderate Problems (Uncommon)  Seizure (jerking or staring) (about 1 child out of 14,000).   Non-stop crying, for 3 hours or more (up to about 1 child out of 1,000).   High fever, over 105 F (40.6 C) (about 1 child out of 16,000).  Severe Problems (Very Rare)  Serious allergic reaction (less than 1 out of a million doses).   Several other severe problems have been reported after DTaP vaccine. These include:   Long-term seizures, coma, or lowered consciousness.   Permanent brain damage.  These are so rare it is hard to tell if they are caused by the vaccine. Controlling fever is especially important for children who have had seizures, for any reason. It is also important if another family member has had seizures. You can reduce fever and pain by giving your child  an aspirin-free pain reliever when the shot is given, and for the next 24 hours, following the package instructions. WHAT IF THERE IS A MODERATE OR SEVERE REACTION? What should I look for? Any unusual conditions, such as a serious allergic reaction, high fever, or unusual behavior. Serious allergic reactions are extremely rare with any vaccine. If one were to occur, it would most likely be within a few minutes to a few hours after the shot. Signs can include difficulty breathing, hoarseness or wheezing, hives, paleness, weakness, a fast heartbeat, or dizziness. If a high fever or seizure were to occur, it would usually be within a week after the shot. What should I do?  Call your caregiver or get the person to a caregiver right away.   Tell the caregiver what happened, the date and time it happened, and when the vaccination was given.   Ask the caregiver, nurse, or health department to file a Vaccine Adverse Event Reporting System (VAERS) form. Or, you can file this report through the VAERS website at www.vaers.LAgents.no or by calling 1-410-041-9318.  VAERS does not provide medical advice. THE NATIONAL VACCINE INJURY COMPENSATION PROGRAM  In the rare event that you or your child has a serious reaction to a vaccine, a federal program has been created to help you pay for the care of those who have been harmed.   For details about the National Vaccine Injury Compensation Program, call 587-081-7318 or visit the program's website at SpiritualWord.at  HOW CAN I LEARN MORE?  Ask your caregiver. They can give you the vaccine package insert or suggest other sources of information.   Call your local or state health department's immunization program.   Contact the Centers for Disease Control and Prevention (CDC):   Call 309 692 8170 (1-800-CDC-INFO).   Visit the [employer] at PicCapture.uy  CDC Diphtheria, Tetanus, and Pertussis (DTaP) Vaccine  VIS (02/05/06) Document Released: 07/06/2006 Document Revised: 08/28/2011 Document Reviewed: 07/06/2006 [facility] Patient Information 2012 [place], [place].

## 2012-06-09 ENCOUNTER — Encounter: Payer: Self-pay | Admitting: Gynecology

## 2012-06-09 LAB — URINALYSIS W MICROSCOPIC + REFLEX CULTURE
Bacteria, UA: NONE SEEN
Bilirubin Urine: NEGATIVE
Crystals: NONE SEEN
Glucose, UA: NEGATIVE mg/dL
Ketones, ur: NEGATIVE mg/dL
Specific Gravity, Urine: 1.025 (ref 1.005–1.030)
Squamous Epithelial / LPF: NONE SEEN
pH: 6.5 (ref 5.0–8.0)

## 2012-06-09 LAB — TSH: TSH: 1.338 u[IU]/mL (ref 0.350–4.500)

## 2012-06-10 ENCOUNTER — Telehealth: Payer: Self-pay | Admitting: *Deleted

## 2012-06-10 DIAGNOSIS — E7800 Pure hypercholesterolemia, unspecified: Secondary | ICD-10-CM

## 2012-06-10 DIAGNOSIS — R7989 Other specified abnormal findings of blood chemistry: Secondary | ICD-10-CM

## 2012-06-10 DIAGNOSIS — N390 Urinary tract infection, site not specified: Secondary | ICD-10-CM

## 2012-06-10 DIAGNOSIS — R7309 Other abnormal glucose: Secondary | ICD-10-CM

## 2012-06-10 LAB — URINE CULTURE: Colony Count: 9000

## 2012-06-10 NOTE — Telephone Encounter (Signed)
Orders placed pt will call back to make lab appointment.

## 2012-06-10 NOTE — Telephone Encounter (Signed)
Message copied by Aura Camps on Thu Jun 10, 2012 10:26 AM ------      Message from: Ok Edwards      Created: Thu Jun 10, 2012  8:38 AM       Just wanted to make sure you get my previous message since my computer locked. The knee no thank you JF      ----- Message -----         From: [facility]         Sent: 06/08/2012   9:24 PM           To: Ok Edwards, MD

## 2012-06-14 ENCOUNTER — Encounter: Payer: Self-pay | Admitting: Gynecology

## 2012-06-16 ENCOUNTER — Encounter: Payer: Self-pay | Admitting: Gynecology

## 2012-06-18 ENCOUNTER — Other Ambulatory Visit: Payer: BC Managed Care – PPO

## 2012-06-18 ENCOUNTER — Telehealth: Payer: Self-pay | Admitting: *Deleted

## 2012-06-18 DIAGNOSIS — N644 Mastodynia: Secondary | ICD-10-CM

## 2012-06-18 DIAGNOSIS — E7800 Pure hypercholesterolemia, unspecified: Secondary | ICD-10-CM

## 2012-06-18 DIAGNOSIS — R7989 Other specified abnormal findings of blood chemistry: Secondary | ICD-10-CM

## 2012-06-18 DIAGNOSIS — R7309 Other abnormal glucose: Secondary | ICD-10-CM

## 2012-06-18 DIAGNOSIS — N390 Urinary tract infection, site not specified: Secondary | ICD-10-CM

## 2012-06-18 LAB — CBC WITH DIFFERENTIAL/PLATELET
Eosinophils Absolute: 0.2 10*3/uL (ref 0.0–0.7)
Eosinophils Relative: 1 % (ref 0–5)
HCT: 38.1 % (ref 36.0–46.0)
Lymphocytes Relative: 30 % (ref 12–46)
Lymphs Abs: 3.9 10*3/uL (ref 0.7–4.0)
MCH: 28.8 pg (ref 26.0–34.0)
MCV: 84.3 fL (ref 78.0–100.0)
Monocytes Absolute: 0.6 10*3/uL (ref 0.1–1.0)
Platelets: 358 10*3/uL (ref 150–400)
RBC: 4.52 MIL/uL (ref 3.87–5.11)
RDW: 13.8 % (ref 11.5–15.5)

## 2012-06-18 LAB — LIPID PANEL
HDL: 41 mg/dL (ref >39)
LDL Cholesterol: 112 mg/dL — ABNORMAL HIGH (ref 0–99)
Triglycerides: 148 mg/dL (ref <150)
VLDL: 30 mg/dL (ref 0–40)

## 2012-06-18 NOTE — Telephone Encounter (Signed)
Per office note 06/08/12 pt should have bil. Diag. Mammo. Scheduled. Order placed.

## 2012-06-19 LAB — URINALYSIS W MICROSCOPIC + REFLEX CULTURE
Bacteria, UA: NONE SEEN
Ketones, ur: NEGATIVE mg/dL
Leukocytes, UA: NEGATIVE
Nitrite: NEGATIVE
Protein, ur: NEGATIVE mg/dL
Urobilinogen, UA: 0.2 mg/dL (ref 0.0–1.0)
pH: 6 (ref 5.0–8.0)

## 2012-06-21 ENCOUNTER — Telehealth: Payer: Self-pay | Admitting: Gynecology

## 2012-06-21 MED ORDER — AZITHROMYCIN 250 MG PO TABS: ORAL_TABLET | ORAL | Status: DC

## 2012-06-21 NOTE — Telephone Encounter (Signed)
Telephone conversation with Linda Winters in reference her recent lab work. She was in the office for her annual exam on September 17 and her white blood count was 13.3 she was asked to return to the office to repeat it along with a fasting lipid profile since her screening cholesterol was elevated at 210. Her white blood count was down to 12.7. On questioning she states that she has had postnasal drip and sinus congestion for the past 2 weeks which she did not inform me of when she was here in the office. I'm going to call her in the Z-Pak and asked her to buy over-the-counter Mucinex and to come by the office at the end of next week to repeat her CBC. Her lipid profile and look good total cholesterol triglycerides HDL VLDL and cholesterol/HDL ratio were normal but her LDL was slightly elevated 161. We discussed importance of low fat diet and regular exercise.

## 2012-06-28 NOTE — Telephone Encounter (Signed)
Spoke with pt regarding breast center appointment, pt will call breast center a speak with them.

## 2012-06-30 NOTE — Telephone Encounter (Signed)
Appointment 07/01/12 @ 8:20 am

## 2012-07-01 ENCOUNTER — Ambulatory Visit
Admission: RE | Admit: 2012-07-01 | Discharge: 2012-07-01 | Disposition: A | Discharge status: Home / Self Care | Payer: BC Managed Care – PPO | Source: Ambulatory Visit | Attending: Gynecology | Admitting: Gynecology

## 2012-07-01 ENCOUNTER — Other Ambulatory Visit: Payer: Self-pay | Admitting: Gynecology

## 2012-07-01 DIAGNOSIS — N644 Mastodynia: Secondary | ICD-10-CM

## 2012-11-06 ENCOUNTER — Other Ambulatory Visit: Payer: Self-pay

## 2012-12-15 ENCOUNTER — Other Ambulatory Visit: Payer: Self-pay | Admitting: *Deleted

## 2012-12-15 DIAGNOSIS — N63 Unspecified lump in unspecified breast: Secondary | ICD-10-CM

## 2013-07-28 ENCOUNTER — Other Ambulatory Visit: Payer: Self-pay

## 2013-08-10 ENCOUNTER — Encounter: Payer: Self-pay | Admitting: Gynecology

## 2013-08-10 ENCOUNTER — Ambulatory Visit (INDEPENDENT_AMBULATORY_CARE_PROVIDER_SITE_OTHER): Payer: BC Managed Care – PPO | Admitting: Gynecology

## 2013-08-10 ENCOUNTER — Other Ambulatory Visit (HOSPITAL_COMMUNITY)
Admission: RE | Admit: 2013-08-10 | Discharge: 2013-08-10 | Disposition: A | Discharge status: Home / Self Care | Payer: BC Managed Care – PPO | Source: Ambulatory Visit | Attending: Gynecology | Admitting: Gynecology

## 2013-08-10 VITALS — BP 126/78 | Ht 67.0 in | Wt 219.0 lb

## 2013-08-10 DIAGNOSIS — Z01419 Encounter for gynecological examination (general) (routine) without abnormal findings: Secondary | ICD-10-CM | POA: Insufficient documentation

## 2013-08-10 DIAGNOSIS — R635 Abnormal weight gain: Secondary | ICD-10-CM

## 2013-08-10 DIAGNOSIS — Z1151 Encounter for screening for human papillomavirus (HPV): Secondary | ICD-10-CM | POA: Insufficient documentation

## 2013-08-10 DIAGNOSIS — Z87410 Personal history of cervical dysplasia: Secondary | ICD-10-CM

## 2013-08-10 DIAGNOSIS — N912 Amenorrhea, unspecified: Secondary | ICD-10-CM

## 2013-08-10 DIAGNOSIS — N951 Menopausal and female climacteric states: Secondary | ICD-10-CM

## 2013-08-10 DIAGNOSIS — Z833 Family history of diabetes mellitus: Secondary | ICD-10-CM

## 2013-08-10 DIAGNOSIS — Z23 Encounter for immunization: Secondary | ICD-10-CM

## 2013-08-10 LAB — CBC WITH DIFFERENTIAL/PLATELET
Basophils Relative: 0 % (ref 0–1)
Eosinophils Relative: 1 % (ref 0–5)
HCT: 37.3 % (ref 36.0–46.0)
Hemoglobin: 13 g/dL (ref 12.0–15.0)
Lymphocytes Relative: 32 % (ref 12–46)
MCHC: 34.9 g/dL (ref 30.0–36.0)
MCV: 82.5 fL (ref 78.0–100.0)
Monocytes Absolute: 0.7 10*3/uL (ref 0.1–1.0)
Monocytes Relative: 6 % (ref 3–12)
Neutro Abs: 7 10*3/uL (ref 1.7–7.7)
RDW: 14.3 % (ref 11.5–15.5)

## 2013-08-10 LAB — PREGNANCY, URINE: Preg Test, Ur: NEGATIVE

## 2013-08-10 LAB — FOLLICLE STIMULATING HORMONE: FSH: 6.5 m[IU]/mL

## 2013-08-10 LAB — TSH: TSH: 1.481 u[IU]/mL (ref 0.350–4.500)

## 2013-08-10 LAB — COMPREHENSIVE METABOLIC PANEL
Albumin: 4.4 g/dL (ref 3.5–5.2)
BUN: 11 mg/dL (ref 6–23)
CO2: 26 mEq/L (ref 19–32)
Glucose, Bld: 88 mg/dL (ref 70–99)
Potassium: 3.9 mEq/L (ref 3.5–5.3)
Sodium: 138 mEq/L (ref 135–145)
Total Protein: 7.6 g/dL (ref 6.0–8.3)

## 2013-08-10 LAB — PROLACTIN: Prolactin: 7.1 ng/mL

## 2013-08-10 MED ORDER — MEDROXYPROGESTERONE ACETATE 10 MG PO TABS: 10.0000 mg | ORAL_TABLET | Freq: Every day | ORAL | Status: DC

## 2013-08-10 NOTE — Progress Notes (Signed)
Linda Winters 04-30-1976 [id number]   History:    37 y.o.  for annual gyn exam with a complaint of no menses since into September. Patient has been using withdrawal as a form of contraception. Patient had no menses in October and no menses in November. A few days ago said she had some mild nausea and vomited. Urine pregnancy test today in the office was negative. She denies any visual disturbances or nipple discharge or unusual headaches. She received a Tdap vaccine last year. She occasionally complains of some hot flashes. She has gained weight since last year partially 7 pounds.  Patient had been seen on 12/01/2010. Patient had an abnormal Pap smear time of her annual exam in January 13 which demonstrated low-grade squamous intraepithelial lesion with high-risk HPV. Patient's previous Pap smear had all been normal in the past. Patient monogamous relationship. Patient underwent colposcopic evaluation on February 23rd 2012 whereby CIN-1 was noted at the 11:00 position of the ectocervix and had benign ECC. The new guidelines by the American Society ofColposcopy and CervicalPathology were discussed with the patient and recommended expectant management of women such as herself with biopsy confirming CIN-1 with full visualization of the lesion and expected compliance for followup was given the option of HPV testing at 12 months or a repeat cytology in 6-12 months. She decided to followup with her Pap smear which she did in 2013 and it was normal. Patient elects have her flu vaccine today.   Past medical history,surgical history, family history and social history were all reviewed and documented in the EPIC chart.  Gynecologic History Patient's last menstrual period was 06/10/2013. Contraception: none Last Pap: 2013. Results were: normal Last mammogram: not indicated. Results were: normal  Obstetric History OB History  Gravida Para Term Preterm AB SAB TAB Ectopic Multiple Living  3 2 2  1 1    2       # Outcome Date GA Lbr Len/2nd Weight Sex Delivery Anes PTL Lv  3 SAB           2 TRM     M SVD  N Y  1 TRM     F SVD  N Y       ROS: A ROS was performed and pertinent positives and negatives are included in the history.  GENERAL: No fevers or chills. HEENT: No change in vision, no earache, sore throat or sinus congestion. NECK: No pain or stiffness. CARDIOVASCULAR: No chest pain or pressure. No palpitations. PULMONARY: No shortness of breath, cough or wheeze. GASTROINTESTINAL: No abdominal pain, nausea, vomiting or diarrhea, melena or bright red blood per rectum. GENITOURINARY: No urinary frequency, urgency, hesitancy or dysuria. MUSCULOSKELETAL: No joint or muscle pain, no back pain, no recent trauma. DERMATOLOGIC: No rash, no itching, no lesions. ENDOCRINE: No polyuria, polydipsia, no heat or cold intolerance. No recent change in weight. HEMATOLOGICAL: No anemia or easy bruising or bleeding. NEUROLOGIC: No headache, seizures, numbness, tingling or weakness. PSYCHIATRIC: No depression, no loss of interest in normal activity or change in sleep pattern.     Exam: chaperone present  BP 126/78  Ht 5\' 7"  (1.702 m)  Wt 219 lb (99.338 kg)  BMI 34.29 kg/m2  LMP 06/10/2013  Body mass index is 34.29 kg/(m^2).  General appearance : Well developed well nourished female. No acute distress HEENT: Neck supple, trachea midline, no carotid bruits, no thyroidmegaly Lungs: Clear to auscultation, no rhonchi or wheezes, or rib retractions  Heart: Regular rate and rhythm, no  murmurs or gallops Breast:Examined in sitting and supine position were symmetrical in appearance, no palpable masses or tenderness,  no skin retraction, no nipple inversion, no nipple discharge, no skin discoloration, no axillary or supraclavicular lymphadenopathy Abdomen: no palpable masses or tenderness, no rebound or guarding Extremities: no edema or skin discoloration or tenderness  Pelvic:  Bartholin, Urethra, Skene Glands:  Within normal limits             Vagina: No gross lesions or discharge  Cervix: No gross lesions or discharge  Uterus  anteverted, normal size, shape and consistency, non-tender and mobile  Adnexa  Without masses or tenderness  Anus and perineum  normal   Rectovaginal  normal sphincter tone without palpated masses or tenderness             Hemoccult none indicated   Urine pregnancy test negative  Assessment/Plan:  37 y.o. female for annual exam with secondary amenorrhea attributed to weight gain possibly. We will check an FSH, TSH and prolactin. Along with the following labs screening cholesterol, CBC, and urinalysis. Pap smear was done today with HPV screen. Patient received the flu vaccine today. Patient was prescribed Provera 10 mg to take 1 by mouth daily for the next 10 days to initiate her menses. If she does not have a menstrual cycle at 1 week after the last tablets she will contact the office for further testing. We'll then monitor her cycles thereafter. We discussed importance of monthly self breast examination.  Note: This dictation was prepared with  Dragon/digital dictation along withSmart phrase technology. Any transcriptional errors that result from this process are unintentional.   Ok Edwards MD, 10:44 AM 08/10/2013

## 2013-08-10 NOTE — Patient Instructions (Signed)
Influenza Vaccine (Flu Vaccine, Inactivated) 2013 2014 What You Need to Know WHY GET VACCINATED?  Influenza ("flu") is a contagious disease that spreads around the [place] every winter, usually between October and May.  Flu is caused by the influenza virus, and can be spread by coughing, sneezing, and close contact.  Anyone can get flu, but the risk of getting flu is highest among children. Symptoms come on suddenly and may last several days. They can include:  Fever or chills.  Sore throat.  Muscle aches.  Fatigue.  Cough.  Headache.  Runny or stuffy nose. Flu can make some people much sicker than others. These people include young children, people 65 and older, pregnant women, and people with certain health conditions such as heart, lung or kidney disease, or a weakened immune system. Flu vaccine is especially important for these people, and anyone in close contact with them. Flu can also lead to pneumonia, and make existing medical conditions worse. It can cause diarrhea and seizures in children. Each year thousands of people in the [place] die from flu, and many more are hospitalized. Flu vaccine is the best protection we have from flu and its complications. Flu vaccine also helps prevent spreading flu from person to person. INACTIVATED FLU VACCINE There are 2 types of influenza vaccine:  You are getting an inactivated flu vaccine, which does not contain any live influenza virus. It is given by injection with a needle, and often called the "flu shot."  A different live, attenuated (weakened) influenza vaccine is sprayed into the nostrils. This vaccine is described in a separate Vaccine Information Statement. Flu vaccine is recommended every year. Children 6 months through 8 years of age should get 2 doses the first year they get vaccinated. Flu viruses are always changing. Each year's flu vaccine is made to protect from viruses that are most likely to cause disease  that year. While flu vaccine cannot prevent all cases of flu, it is our best defense against the disease. Inactivated flu vaccine protects against 3 or 4 different influenza viruses. It takes about 2 weeks for protection to develop after the vaccination, and protection lasts several months to a year. Some illnesses that are not caused by influenza virus are often mistaken for flu. Flu vaccine will not prevent these illnesses. It can only prevent influenza. A "high-dose" flu vaccine is available for people 65 years of age and older. The person giving you the vaccine can tell you more about it. Some inactivated flu vaccine contains a very small amount of a mercury-based preservative called thimerosal. Studies have shown that thimerosal in vaccines is not harmful, but flu vaccines that do not contain a preservative are available. SOME PEOPLE SHOULD NOT GET THIS VACCINE Tell the person who gives you the vaccine:  If you have any severe (life-threatening) allergies. If you ever had a life-threatening allergic reaction after a dose of flu vaccine, or have a severe allergy to any part of this vaccine, you may be advised not to get a dose. Most, but not all, types of flu vaccine contain a small amount of egg.  If you ever had Guillain Barr Syndrome (a severe paralyzing illness, also called GBS). Some people with a history of GBS should not get this vaccine. This should be discussed with your doctor.  If you are not feeling well. They might suggest waiting until you feel better. But you should come back. RISKS OF A VACCINE REACTION With a vaccine, like any medicine, there   If you are not feeling well. They might suggest waiting until you feel better. But you should come back.  RISKS OF A VACCINE REACTION  With a vaccine, like any medicine, there is a chance of side effects. These are usually mild and go away on their own.  Serious side effects are also possible, but are very rare. Inactivated flu vaccine does not contain live flu virus, sogetting flu from this vaccine is not possible.  Brief fainting spells and related symptoms (such as jerking movements) can happen after any medical  procedure, including vaccination. Sitting or lying down for about 15 minutes after a vaccination can help prevent fainting and injuries caused by falls. Tell your doctor if you feel dizzy or lightheaded, or have vision changes or ringing in the ears.  Mild problems following inactivated flu vaccine:  · Soreness, redness, or swelling where the shot was given.  · Hoarseness; sore, red or itchy eyes; or cough.  · Fever.  · Aches.  · Headache.  · Itching.  · Fatigue.  If these problems occur, they usually begin soon after the shot and last 1 or 2 days.  Moderate problems following inactivated flu vaccine:  · Young children who get inactivated flu vaccine and pneumococcal vaccine (PCV13) at the same time may be at increased risk for seizures caused by fever. Ask your doctor for more information. Tell your doctor if a child who is getting flu vaccine has ever had a seizure.  Severe problems following inactivated flu vaccine:  · A severe allergic reaction could occur after any vaccine (estimated less than 1 in a million doses).  · There is a small possibility that inactivated flu vaccine could be associated with Guillan Barré Syndrome (GBS), no more than 1 or 2 cases per million people vaccinated. This is much lower than the risk of severe complications from flu, which can be prevented by flu vaccine.  The safety of vaccines is always being monitored. For more information, visit: www.cdc.gov/vaccinesafety/  WHAT IF THERE IS A SERIOUS REACTION?  What should I look for?  · Look for anything that concerns you, such as signs of a severe allergic reaction, very high fever, or behavior changes.  Signs of a severe allergic reaction can include hives, swelling of the face and throat, difficulty breathing, a fast heartbeat, dizziness, and weakness. These would start a few minutes to a few hours after the vaccination.  What should I do?  · If you think it is a severe allergic reaction or other emergency that cannot wait, call 9 1 1  or get the person to the nearest hospital. Otherwise, call your doctor.  · Afterward, the reaction should be reported to the Vaccine Adverse Event Reporting System (VAERS). Your doctor might file this report, or you can do it yourself through the VAERS website at www.vaers.hhs.gov, or by calling 1-800-822-7967.  VAERS is only for reporting reactions. They do not give medical advice.  THE NATIONAL VACCINE INJURY COMPENSATION PROGRAM  The National Vaccine Injury Compensation Program (VICP) is a federal program that was created to compensate people who may have been injured by certain vaccines.  Persons who believe they may have been injured by a vaccine can learn about the program and about filing a claim by calling 1-800-338-2382 or visiting the VICP website at www.hrsa.gov/vaccinecompensation  HOW CAN I LEARN MORE?  · Ask your doctor.  · Call your local or state health department.  · Contact the Centers for Disease Control and Prevention (CDC):  ·

## 2013-08-11 ENCOUNTER — Other Ambulatory Visit: Payer: Self-pay | Admitting: Gynecology

## 2013-08-11 DIAGNOSIS — E7800 Pure hypercholesterolemia, unspecified: Secondary | ICD-10-CM

## 2013-08-11 DIAGNOSIS — R3129 Other microscopic hematuria: Secondary | ICD-10-CM

## 2013-08-11 DIAGNOSIS — D72829 Elevated white blood cell count, unspecified: Secondary | ICD-10-CM

## 2013-08-11 LAB — URINALYSIS W MICROSCOPIC + REFLEX CULTURE
Bacteria, UA: NONE SEEN
Casts: NONE SEEN
Crystals: NONE SEEN
Leukocytes, UA: NEGATIVE
Nitrite: NEGATIVE
Specific Gravity, Urine: >1.03 — ABNORMAL HIGH (ref 1.005–1.030)
Urobilinogen, UA: 0.2 mg/dL (ref 0.0–1.0)
pH: 6 (ref 5.0–8.0)

## 2013-09-01 ENCOUNTER — Other Ambulatory Visit: Payer: BC Managed Care – PPO

## 2013-09-14 ENCOUNTER — Other Ambulatory Visit: Payer: BC Managed Care – PPO

## 2013-09-14 DIAGNOSIS — E7800 Pure hypercholesterolemia, unspecified: Secondary | ICD-10-CM

## 2013-09-14 DIAGNOSIS — D72829 Elevated white blood cell count, unspecified: Secondary | ICD-10-CM

## 2013-09-14 DIAGNOSIS — R3129 Other microscopic hematuria: Secondary | ICD-10-CM

## 2013-09-14 LAB — CBC WITH DIFFERENTIAL/PLATELET
Basophils Absolute: 0 10*3/uL (ref 0.0–0.1)
Basophils Relative: 0 % (ref 0–1)
Eosinophils Absolute: 0.1 10*3/uL (ref 0.0–0.7)
Eosinophils Relative: 1 % (ref 0–5)
HCT: 36.8 % (ref 36.0–46.0)
Lymphocytes Relative: 32 % (ref 12–46)
Lymphs Abs: 3.8 10*3/uL (ref 0.7–4.0)
MCHC: 34.8 g/dL (ref 30.0–36.0)
MCV: 84 fL (ref 78.0–100.0)
Monocytes Absolute: 0.6 10*3/uL (ref 0.1–1.0)
Neutrophils Relative %: 62 % (ref 43–77)
Platelets: 342 10*3/uL (ref 150–400)
RBC: 4.38 MIL/uL (ref 3.87–5.11)
RDW: 13.8 % (ref 11.5–15.5)
WBC: 12 10*3/uL — ABNORMAL HIGH (ref 4.0–10.5)

## 2013-09-14 LAB — LIPID PANEL
Cholesterol: 205 mg/dL — ABNORMAL HIGH (ref 0–200)
HDL: 38 mg/dL — ABNORMAL LOW (ref >39)
LDL Cholesterol: 145 mg/dL — ABNORMAL HIGH (ref 0–99)
Total CHOL/HDL Ratio: 5.4 Ratio
VLDL: 22 mg/dL (ref 0–40)

## 2013-09-15 LAB — URINALYSIS W MICROSCOPIC + REFLEX CULTURE
Bacteria, UA: NONE SEEN
Bilirubin Urine: NEGATIVE
Casts: NONE SEEN
Glucose, UA: NEGATIVE mg/dL
Leukocytes, UA: NEGATIVE
Protein, ur: 30 mg/dL — AB
pH: 6 (ref 5.0–8.0)

## 2014-07-24 ENCOUNTER — Encounter: Payer: Self-pay | Admitting: Gynecology

## 2014-08-31 ENCOUNTER — Other Ambulatory Visit: Payer: Self-pay | Admitting: Family Medicine

## 2014-08-31 DIAGNOSIS — R609 Edema, unspecified: Secondary | ICD-10-CM

## 2014-09-01 ENCOUNTER — Ambulatory Visit
Admission: RE | Admit: 2014-09-01 | Discharge: 2014-09-01 | Disposition: A | Discharge status: Home / Self Care | Payer: BC Managed Care – PPO | Source: Ambulatory Visit | Attending: Family Medicine | Admitting: Family Medicine

## 2014-09-01 DIAGNOSIS — R609 Edema, unspecified: Secondary | ICD-10-CM

## 2014-09-01 MED ORDER — IOHEXOL 300 MG/ML  SOLN
75.0000 mL | Freq: Once | INTRAMUSCULAR | Status: AC | PRN
  Administered 2014-09-01: 75 mL via INTRAVENOUS

## 2014-09-21 ENCOUNTER — Ambulatory Visit (INDEPENDENT_AMBULATORY_CARE_PROVIDER_SITE_OTHER): Payer: BC Managed Care – PPO | Admitting: Gynecology

## 2014-09-21 ENCOUNTER — Other Ambulatory Visit (HOSPITAL_COMMUNITY)
Admission: RE | Admit: 2014-09-21 | Discharge: 2014-09-21 | Disposition: A | Discharge status: Home / Self Care | Payer: BC Managed Care – PPO | Source: Ambulatory Visit | Attending: Gynecology | Admitting: Gynecology

## 2014-09-21 ENCOUNTER — Encounter: Payer: Self-pay | Admitting: Gynecology

## 2014-09-21 VITALS — BP 118/74 | Ht 67.0 in | Wt 222.0 lb

## 2014-09-21 DIAGNOSIS — Z01419 Encounter for gynecological examination (general) (routine) without abnormal findings: Secondary | ICD-10-CM

## 2014-09-21 DIAGNOSIS — Z833 Family history of diabetes mellitus: Secondary | ICD-10-CM

## 2014-09-21 DIAGNOSIS — Z87410 Personal history of cervical dysplasia: Secondary | ICD-10-CM

## 2014-09-21 LAB — URINALYSIS W MICROSCOPIC + REFLEX CULTURE
Bilirubin Urine: NEGATIVE
CRYSTALS: NONE SEEN
Casts: NONE SEEN
Glucose, UA: NEGATIVE mg/dL
Leukocytes, UA: NEGATIVE
Nitrite: NEGATIVE
PROTEIN: 30 mg/dL — AB
SPECIFIC GRAVITY, URINE: 1.02 (ref 1.005–1.030)
UROBILINOGEN UA: 1 mg/dL (ref 0.0–1.0)
WBC, UA: NONE SEEN WBC/hpf (ref <3)
pH: 5 (ref 5.0–8.0)

## 2014-09-21 NOTE — Progress Notes (Signed)
Linda Winters 04-05-1976 [id number]   History:    38 y.o.  for annual gyn exam with no complaints today. She did state that her primary care physician recently as last month oriented MRI because she thought she felt some swollen on the base of her neck on her left side but was reported to be normal and did a CBC which was normal. No other blood work was done.  Past dysplasia history as follows: Patient had been seen on 12/01/2010. Patient had an abnormal Pap smear time of her annual exam in January 13 which demonstrated low-grade squamous intraepithelial lesion with high-risk HPV. Patient's previous Pap smear had all been normal in the past. Patient monogamous relationship. Patient underwent colposcopic evaluation on February 23rd 2012 whereby CIN-1 was noted at the 11:00 position of the ectocervix and had benign ECC. The new guidelines by the American Society ofColposcopy and CervicalPathology were discussed with the patient and recommended expectant management of women such as herself with biopsy confirming CIN-1 with full visualization of the lesion and expected compliance for followup was given the option of HPV testing at 12 months or a repeat cytology in 6-12 months. She decided to followup with her Pap smear which she did in 2013 and it was normal. As was in 2014.  Patient is reporting normal menstrual cycles.  Past medical history,surgical history, family history and social history were all reviewed and documented in the EPIC chart.  Gynecologic History Patient's last menstrual period was 08/23/2014. Contraception: none Last Pap: 2014. Results were: normal Last mammogram: 2013. Results were: normal  Obstetric History OB History  Gravida Para Term Preterm AB SAB TAB Ectopic Multiple Living  3 2 2  1 1    2     # Outcome Date GA Lbr Len/2nd Weight Sex Delivery Anes PTL Lv  3 SAB           2 Term     M Vag-Spont  N Y  1 Term     F Vag-Spont  N Y       ROS: A ROS was performed  and pertinent positives and negatives are included in the history.  GENERAL: No fevers or chills. HEENT: No change in vision, no earache, sore throat or sinus congestion. NECK: No pain or stiffness. CARDIOVASCULAR: No chest pain or pressure. No palpitations. PULMONARY: No shortness of breath, cough or wheeze. GASTROINTESTINAL: No abdominal pain, nausea, vomiting or diarrhea, melena or bright red blood per rectum. GENITOURINARY: No urinary frequency, urgency, hesitancy or dysuria. MUSCULOSKELETAL: No joint or muscle pain, no back pain, no recent trauma. DERMATOLOGIC: No rash, no itching, no lesions. ENDOCRINE: No polyuria, polydipsia, no heat or cold intolerance. No recent change in weight. HEMATOLOGICAL: No anemia or easy bruising or bleeding. NEUROLOGIC: No headache, seizures, numbness, tingling or weakness. PSYCHIATRIC: No depression, no loss of interest in normal activity or change in sleep pattern.     Exam: chaperone present  BP 118/74 mmHg  Ht 5\' 7"  (1.702 m)  Wt 222 lb (100.699 kg)  BMI 34.76 kg/m2  LMP 08/23/2014  Body mass index is 34.76 kg/(m^2).  General appearance : Well developed well nourished female. No acute distress HEENT: Neck supple, trachea midline, no carotid bruits, no thyroidmegaly Lungs: Clear to auscultation, no rhonchi or wheezes, or rib retractions  Heart: Regular rate and rhythm, no murmurs or gallops Breast:Examined in sitting and supine position were symmetrical in appearance, no palpable masses or tenderness,  no skin retraction, no nipple inversion,  no nipple discharge, no skin discoloration, no axillary or supraclavicular lymphadenopathy Abdomen: no palpable masses or tenderness, no rebound or guarding Extremities: no edema or skin discoloration or tenderness  Pelvic:  Bartholin, Urethra, Skene Glands: Within normal limits             Vagina: No gross lesions or discharge  Cervix: No gross lesions or discharge  Uterus  anteverted, normal size, shape and  consistency, non-tender and mobile  Adnexa  Without masses or tenderness  Anus and perineum  normal   Rectovaginal  normal sphincter tone without palpated masses or tenderness             Hemoccult not indicated     Assessment/Plan:  38 y.o. female for annual exam will return back next week in a fasting state for the following labs: Fasting lipid profile, conference metabolic panel, TSH, CBC and urinalysis. Patient was reminded on the importance of monthly breast exam. We discussed importance of calcium vitamin D and regular exercise for osteoporosis prevention. Pap smear was done today. If this Pap smear is normal this year we will adhered to the new guidelines and do Pap smears every 3 years.   Terrance Mass MD, 4:47 PM 09/21/2014

## 2014-09-26 LAB — URINE CULTURE
Colony Count: NO GROWTH
Organism ID, Bacteria: NO GROWTH

## 2014-09-27 LAB — CYTOLOGY - PAP

## 2014-11-17 ENCOUNTER — Telehealth: Payer: Self-pay | Admitting: Anesthesiology

## 2014-11-17 NOTE — Telephone Encounter (Signed)
Called patient to remind her that she needs to come back to the office to have her labs drawn. Lab appt has been made. She will come in on Monday 2/29

## 2014-11-20 ENCOUNTER — Other Ambulatory Visit: Payer: Self-pay

## 2015-06-12 ENCOUNTER — Emergency Department (HOSPITAL_BASED_OUTPATIENT_CLINIC_OR_DEPARTMENT_OTHER): Payer: BLUE CROSS/BLUE SHIELD

## 2015-06-12 ENCOUNTER — Encounter (HOSPITAL_BASED_OUTPATIENT_CLINIC_OR_DEPARTMENT_OTHER): Payer: Self-pay | Admitting: Emergency Medicine

## 2015-06-12 DIAGNOSIS — R079 Chest pain, unspecified: Secondary | ICD-10-CM | POA: Insufficient documentation

## 2015-06-12 NOTE — ED Notes (Signed)
Cp for about 20 mins had shoulder pain yesterday but thought she had slept on it wrong

## 2015-06-13 ENCOUNTER — Encounter (HOSPITAL_BASED_OUTPATIENT_CLINIC_OR_DEPARTMENT_OTHER): Payer: Self-pay | Admitting: Emergency Medicine

## 2015-06-13 ENCOUNTER — Emergency Department (HOSPITAL_BASED_OUTPATIENT_CLINIC_OR_DEPARTMENT_OTHER)
Admission: EM | Admit: 2015-06-13 | Discharge: 2015-06-13 | Discharge status: Against Medical Advice | Payer: BLUE CROSS/BLUE SHIELD | Attending: Emergency Medicine | Admitting: Emergency Medicine

## 2015-09-27 ENCOUNTER — Encounter: Payer: Self-pay | Admitting: Gynecology

## 2015-10-25 ENCOUNTER — Encounter: Payer: Self-pay | Admitting: Gynecology

## 2015-10-25 ENCOUNTER — Ambulatory Visit (INDEPENDENT_AMBULATORY_CARE_PROVIDER_SITE_OTHER): Payer: BLUE CROSS/BLUE SHIELD | Admitting: Gynecology

## 2015-10-25 ENCOUNTER — Other Ambulatory Visit (HOSPITAL_COMMUNITY)
Admission: RE | Admit: 2015-10-25 | Discharge: 2015-10-25 | Disposition: A | Discharge status: Home / Self Care | Payer: BLUE CROSS/BLUE SHIELD | Source: Ambulatory Visit | Attending: Gynecology | Admitting: Gynecology

## 2015-10-25 VITALS — BP 126/78 | Ht 69.0 in | Wt 222.0 lb

## 2015-10-25 DIAGNOSIS — Z87410 Personal history of cervical dysplasia: Secondary | ICD-10-CM

## 2015-10-25 DIAGNOSIS — Z1151 Encounter for screening for human papillomavirus (HPV): Secondary | ICD-10-CM | POA: Diagnosis not present

## 2015-10-25 DIAGNOSIS — Z01419 Encounter for gynecological examination (general) (routine) without abnormal findings: Secondary | ICD-10-CM

## 2015-10-25 DIAGNOSIS — Z3009 Encounter for other general counseling and advice on contraception: Secondary | ICD-10-CM | POA: Diagnosis not present

## 2015-10-25 DIAGNOSIS — N915 Oligomenorrhea, unspecified: Secondary | ICD-10-CM | POA: Diagnosis not present

## 2015-10-25 MED ORDER — LEVONORGESTREL-ETHINYL ESTRAD 0.1-20 MG-MCG PO TABS: 1.0000 | ORAL_TABLET | Freq: Every day | ORAL | Status: DC

## 2015-10-25 NOTE — Progress Notes (Signed)
Linda Winters [birth year] [id number]   History:    40 y.o.  for annual gyn exam with complaints of at times skipping cycles of to 2 months. She also has cramping and passage of clots when she has her menstrual cycles. Patient not using any form of contraception.  Past dysplasia history as follows: Patient had been seen on 12/01/2010. Patient had an abnormal Pap smear time of her annual exam in January 13 which demonstrated low-grade squamous intraepithelial lesion with high-risk HPV. Patient's previous Pap smear had all been normal in the past. Patient monogamous relationship. Patient underwent colposcopic evaluation on February 23rd 2012 whereby CIN-1 was noted at the 11:00 position of the ectocervix and had benign ECC. The new guidelines by the American Society ofColposcopy and CervicalPathology were discussed with the patient and recommended expectant management of women such as herself with biopsy confirming CIN-1 with full visualization of the lesion and expected compliance for followup was given the option of HPV testing at 12 months or a repeat cytology in 6-12 months. She decided to followup with her Pap smear which she did in 2013 and it was normal. Pap smear was normal in 2014 2015.  Patient did have a mammogram in 2013 as a result of left breast mastodynia  Past medical history,surgical history, family history and social history were all reviewed and documented in the EPIC chart.  Gynecologic History Patient's last menstrual period was 09/22/2015. Contraception: none Last Pap: 2015. Results were: normal Last mammogram: See above Results were: See above  Obstetric History OB History  Gravida Para Term Preterm AB SAB TAB Ectopic Multiple Living  3 2 2  1 1    2     # Outcome Date GA Lbr Len/2nd Weight Sex Delivery Anes PTL Lv  3 SAB           2 Term     M Vag-Spont  N Y  1 Term     F Vag-Spont  N Y       ROS: A ROS was performed and pertinent positives and negatives are  included in the history.  GENERAL: No fevers or chills. HEENT: No change in vision, no earache, sore throat or sinus congestion. NECK: No pain or stiffness. CARDIOVASCULAR: No chest pain or pressure. No palpitations. PULMONARY: No shortness of breath, cough or wheeze. GASTROINTESTINAL: No abdominal pain, nausea, vomiting or diarrhea, melena or bright red blood per rectum. GENITOURINARY: No urinary frequency, urgency, hesitancy or dysuria. MUSCULOSKELETAL: No joint or muscle pain, no back pain, no recent trauma. DERMATOLOGIC: No rash, no itching, no lesions. ENDOCRINE: No polyuria, polydipsia, no heat or cold intolerance. No recent change in weight. HEMATOLOGICAL: No anemia or easy bruising or bleeding. NEUROLOGIC: No headache, seizures, numbness, tingling or weakness. PSYCHIATRIC: No depression, no loss of interest in normal activity or change in sleep pattern.     Exam: chaperone present  BP 126/78 mmHg  Ht 5\' 9"  (1.753 m)  Wt 222 lb (100.699 kg)  BMI 32.77 kg/m2  LMP 09/22/2015  Body mass index is 32.77 kg/(m^2).  General appearance : Well developed well nourished female. No acute distress HEENT: Eyes: no retinal hemorrhage or exudates,  Neck supple, trachea midline, no carotid bruits, no thyroidmegaly Lungs: Clear to auscultation, no rhonchi or wheezes, or rib retractions  Heart: Regular rate and rhythm, no murmurs or gallops Breast:Examined in sitting and supine position were symmetrical in appearance, no palpable masses or tenderness,  no skin retraction, no nipple inversion, no nipple  discharge, no skin discoloration, no axillary or supraclavicular lymphadenopathy Abdomen: no palpable masses or tenderness, no rebound or guarding Extremities: no edema or skin discoloration or tenderness  Pelvic:  Bartholin, Urethra, Skene Glands: Within normal limits             Vagina: No gross lesions or discharge  Cervix: No gross lesions or discharge  Uterus  anteverted, normal size, shape and  consistency, non-tender and mobile  Adnexa  Without masses or tenderness  Anus and perineum  normal   Rectovaginal  normal sphincter tone without palpated masses or tenderness             Hemoccult not indicated     Assessment/Plan:  40 y.o. female for annual exam with episodes of oligomenorrhea and which she does have cycles she has severe cramping and passage of blood clots. Since she is not using anything for contraception we discussed putting her on a 20 g oral contraceptive pill such as Alesse to regulate her cycle. Risk benefits and pros and cons were discussed. Pap smear with HPV screening was done today. The following blood work was ordered today : CBC, comprehensive metabolic panel, lipid profile, TSH, prolactin and urinalysis.   Terrance Mass MD, 12:25 PM 10/25/2015

## 2015-10-25 NOTE — Patient Instructions (Signed)

## 2015-10-26 ENCOUNTER — Other Ambulatory Visit: Payer: BLUE CROSS/BLUE SHIELD

## 2015-10-26 ENCOUNTER — Telehealth: Payer: Self-pay | Admitting: *Deleted

## 2015-10-26 LAB — CBC WITH DIFFERENTIAL/PLATELET
BASOS PCT: 0 % (ref 0–1)
Basophils Absolute: 0 10*3/uL (ref 0.0–0.1)
Eosinophils Absolute: 0.1 10*3/uL (ref 0.0–0.7)
Eosinophils Relative: 1 % (ref 0–5)
HEMATOCRIT: 39.3 % (ref 36.0–46.0)
HEMOGLOBIN: 13.3 g/dL (ref 12.0–15.0)
LYMPHS PCT: 28 % (ref 12–46)
Lymphs Abs: 3.8 10*3/uL (ref 0.7–4.0)
MCH: 29.7 pg (ref 26.0–34.0)
MCHC: 33.8 g/dL (ref 30.0–36.0)
MCV: 87.7 fL (ref 78.0–100.0)
MONO ABS: 0.5 10*3/uL (ref 0.1–1.0)
MONOS PCT: 4 % (ref 3–12)
MPV: 9.5 fL (ref 8.6–12.4)
NEUTROS ABS: 9.2 10*3/uL — AB (ref 1.7–7.7)
NEUTROS PCT: 67 % (ref 43–77)
Platelets: 342 10*3/uL (ref 150–400)
RBC: 4.48 MIL/uL (ref 3.87–5.11)
RDW: 14.4 % (ref 11.5–15.5)
WBC: 13.7 10*3/uL — ABNORMAL HIGH (ref 4.0–10.5)

## 2015-10-26 LAB — URINALYSIS W MICROSCOPIC + REFLEX CULTURE
BACTERIA UA: NONE SEEN [HPF]
Bilirubin Urine: NEGATIVE
CASTS: NONE SEEN [LPF]
CRYSTALS: NONE SEEN [HPF]
Glucose, UA: NEGATIVE
Hgb urine dipstick: NEGATIVE
KETONES UR: NEGATIVE
Leukocytes, UA: NEGATIVE
Nitrite: NEGATIVE
SPECIFIC GRAVITY, URINE: 1.03 (ref 1.001–1.035)
WBC, UA: NONE SEEN WBC/HPF (ref <=5)
Yeast: NONE SEEN [HPF]
pH: 8 (ref 5.0–8.0)

## 2015-10-26 LAB — COMPREHENSIVE METABOLIC PANEL
ALBUMIN: 4.1 g/dL (ref 3.6–5.1)
ALT: 12 U/L (ref 6–29)
AST: 19 U/L (ref 10–30)
Alkaline Phosphatase: 80 U/L (ref 33–115)
BUN: 10 mg/dL (ref 7–25)
CALCIUM: 9 mg/dL (ref 8.6–10.2)
CO2: 26 mmol/L (ref 20–31)
CREATININE: 0.63 mg/dL (ref 0.50–1.10)
Chloride: 105 mmol/L (ref 98–110)
Glucose, Bld: 97 mg/dL (ref 65–99)
Potassium: 4.2 mmol/L (ref 3.5–5.3)
SODIUM: 137 mmol/L (ref 135–146)
TOTAL PROTEIN: 6.9 g/dL (ref 6.1–8.1)
Total Bilirubin: 0.6 mg/dL (ref 0.2–1.2)

## 2015-10-26 LAB — LIPID PANEL
CHOL/HDL RATIO: 5.1 ratio — AB (ref <=5.0)
Cholesterol: 193 mg/dL (ref 125–200)
HDL: 38 mg/dL — ABNORMAL LOW (ref >=46)
LDL Cholesterol: 126 mg/dL (ref <130)
Triglycerides: 145 mg/dL (ref <150)
VLDL: 29 mg/dL (ref <30)

## 2015-10-26 LAB — CYTOLOGY - PAP

## 2015-10-26 LAB — TSH: TSH: 1.145 u[IU]/mL (ref 0.350–4.500)

## 2015-10-26 LAB — PROLACTIN: PROLACTIN: 6.1 ng/mL

## 2015-10-26 NOTE — Telephone Encounter (Signed)
-----   Message from Terrance Mass, MD sent at 10/26/2015 10:02 AM EST ----- Please inform Ciomara I would like to refer her [facility] because of her persistant proteinuria especially with her history of Hemoglobic C trait

## 2015-10-26 NOTE — Telephone Encounter (Signed)
Left message for pt to call.

## 2015-10-28 LAB — URINE CULTURE

## 2015-10-29 ENCOUNTER — Other Ambulatory Visit: Payer: Self-pay | Admitting: Gynecology

## 2015-10-29 MED ORDER — NITROFURANTOIN MONOHYD MACRO 100 MG PO CAPS: 100.0000 mg | ORAL_CAPSULE | Freq: Two times a day (BID) | ORAL | Status: DC

## 2015-10-29 NOTE — Telephone Encounter (Signed)
Pt informed with the below, referral will be faxed they will contact pt to scheduled and fax me back with time and date.

## 2015-11-06 ENCOUNTER — Encounter (HOSPITAL_BASED_OUTPATIENT_CLINIC_OR_DEPARTMENT_OTHER): Payer: Self-pay | Admitting: *Deleted

## 2015-11-06 ENCOUNTER — Emergency Department (HOSPITAL_BASED_OUTPATIENT_CLINIC_OR_DEPARTMENT_OTHER): Payer: BLUE CROSS/BLUE SHIELD

## 2015-11-06 ENCOUNTER — Emergency Department (HOSPITAL_BASED_OUTPATIENT_CLINIC_OR_DEPARTMENT_OTHER)
Admission: EM | Admit: 2015-11-06 | Discharge: 2015-11-06 | Disposition: A | Discharge status: Home / Self Care | Payer: BLUE CROSS/BLUE SHIELD | Attending: Emergency Medicine | Admitting: Emergency Medicine

## 2015-11-06 DIAGNOSIS — G43809 Other migraine, not intractable, without status migrainosus: Secondary | ICD-10-CM | POA: Diagnosis not present

## 2015-11-06 DIAGNOSIS — Z862 Personal history of diseases of the blood and blood-forming organs and certain disorders involving the immune mechanism: Secondary | ICD-10-CM | POA: Diagnosis not present

## 2015-11-06 DIAGNOSIS — Z87891 Personal history of nicotine dependence: Secondary | ICD-10-CM | POA: Diagnosis not present

## 2015-11-06 DIAGNOSIS — Z8659 Personal history of other mental and behavioral disorders: Secondary | ICD-10-CM | POA: Insufficient documentation

## 2015-11-06 DIAGNOSIS — Z79899 Other long term (current) drug therapy: Secondary | ICD-10-CM | POA: Insufficient documentation

## 2015-11-06 DIAGNOSIS — Z792 Long term (current) use of antibiotics: Secondary | ICD-10-CM | POA: Diagnosis not present

## 2015-11-06 DIAGNOSIS — R51 Headache: Secondary | ICD-10-CM | POA: Diagnosis present

## 2015-11-06 DIAGNOSIS — Z8742 Personal history of other diseases of the female genital tract: Secondary | ICD-10-CM | POA: Diagnosis not present

## 2015-11-06 MED ORDER — HYDROMORPHONE HCL 1 MG/ML IJ SOLN
0.5000 mg | Freq: Once | INTRAMUSCULAR | Status: AC
  Administered 2015-11-06: 0.5 mg via INTRAVENOUS
  Filled 2015-11-06: qty 1

## 2015-11-06 MED ORDER — PROCHLORPERAZINE EDISYLATE 5 MG/ML IJ SOLN
10.0000 mg | Freq: Four times a day (QID) | INTRAMUSCULAR | Status: DC | PRN
  Administered 2015-11-06: 10 mg via INTRAVENOUS
  Filled 2015-11-06: qty 2

## 2015-11-06 MED ORDER — KETOROLAC TROMETHAMINE 30 MG/ML IJ SOLN
15.0000 mg | Freq: Once | INTRAMUSCULAR | Status: AC
  Administered 2015-11-06: 15 mg via INTRAVENOUS
  Filled 2015-11-06: qty 1

## 2015-11-06 MED ORDER — SODIUM CHLORIDE 0.9 % IV BOLUS (SEPSIS)
1000.0000 mL | Freq: Once | INTRAVENOUS | Status: AC
  Administered 2015-11-06: 1000 mL via INTRAVENOUS

## 2015-11-06 MED ORDER — DIPHENHYDRAMINE HCL 50 MG/ML IJ SOLN
12.5000 mg | Freq: Once | INTRAMUSCULAR | Status: AC
  Administered 2015-11-06: 12.5 mg via INTRAVENOUS
  Filled 2015-11-06: qty 1

## 2015-11-06 NOTE — ED Notes (Signed)
Sudden onset of blurred vision in her left eye followed by migraine headache. Then her left arm and leg got heavy. She had confusion. Worse headache she has ever had on arrival. She is tearful.

## 2015-11-06 NOTE — ED Notes (Signed)
Patient transported to radiology department via stretcher.

## 2015-11-06 NOTE — ED Provider Notes (Signed)
CSN: [id number]     Arrival date & time 11/06/15  1200 History   First MD Initiated Contact with Patient 11/06/15 1212     No chief complaint on file.     HPI   Patient presents with headache that started as a typical headache and then progressively worsen.  It was preceded by blurring in floaters in the left eye.  She then felt heavy on the left side had no definite weakness.  Patient has had photophobia.  Some nausea.  Patient states that her mother and sister both have migraines but she never had a migraine.  Patient is not on birth control pills.   Past Medical History  Diagnosis Date  . NSVD (normal spontaneous vaginal delivery)     X 2  . Endometriosis   . Hemoglobin C trait ([place])   . Anxiety   . Depression    Past Surgical History  Procedure Laterality Date  . Pelvic laparoscopy     Family History  Problem Relation Age of Onset  . Diabetes Father   . Hypertension Father   . Heart disease Father   . Cancer Maternal Uncle     throat  . Cancer Maternal Grandfather     prostate   Social History  Substance Use Topics  . Smoking status: Former Smoker -- 0.25 packs/day    Types: Cigarettes    Quit date: 02/06/2011  . Smokeless tobacco: Never Used  . Alcohol Use: No   OB History    Gravida Para Term Preterm AB TAB SAB Ectopic Multiple Living   3 2 2  1  1   2      Review of Systems  All other systems reviewed and are negative  Allergies  Review of patient's allergies indicates no known allergies.  Home Medications   Prior to Admission medications   Medication Sig Start Date End Date Taking? Authorizing Provider  levonorgestrel-ethinyl estradiol (AVIANE,ALESSE,LESSINA) 0.1-20 MG-MCG tablet Take 1 tablet by mouth daily. 10/25/15   Terrance Mass, MD  nitrofurantoin, macrocrystal-monohydrate, (MACROBID) 100 MG capsule Take 1 capsule (100 mg total) by mouth 2 (two) times daily. 10/29/15   Terrance Mass, MD   BP 122/71 mmHg  Pulse 72  Temp(Src) 98.2 F (36.8  C) (Oral)  Resp 18  Ht 5\' 9"  (1.753 m)  Wt 222 lb (100.699 kg)  BMI 32.77 kg/m2  SpO2 98%  LMP 09/24/2015 Physical Exam Physical Exam  Nursing note and vitals reviewed. Constitutional: She is oriented to person, place, and time. She appears well-developed and well-nourished. No distress.  HENT:  Head: Normocephalic and atraumatic.  Eyes: Pupils are equal, round, and reactive to light.  Neck: Normal range of motion.  Cardiovascular: Normal rate and intact distal pulses.   Pulmonary/Chest: No respiratory distress.  Abdominal: Normal appearance. She exhibits no distension.  Musculoskeletal: Normal range of motion.  Neurological: She is alert and oriented to person, place, and time. No cranial nerve deficit.  patient has no weakness in extremities.  No slurring of speech.  No sensory deficit. Skin: Skin is warm and dry. No rash noted.    ED Course  Procedures (including critical care time) Medications  prochlorperazine (COMPAZINE) injection 10 mg (10 mg Intravenous Given 11/06/15 1410)  diphenhydrAMINE (BENADRYL) injection 12.5 mg (not administered)  HYDROmorphone (DILAUDID) injection 0.5 mg (not administered)  sodium chloride 0.9 % bolus 1,000 mL (1,000 mLs Intravenous New Bag/Given 11/06/15 1409)  diphenhydrAMINE (BENADRYL) injection 12.5 mg (12.5 mg Intravenous Given 11/06/15 1422)  ketorolac (TORADOL) 30 MG/ML injection 15 mg (15 mg Intravenous Given 11/06/15 1422)  diphenhydrAMINE (BENADRYL) injection 12.5 mg (12.5 mg Intravenous Given 11/06/15 1431)    Labs Review Labs Reviewed - No data to display  Imaging Review Ct Head Wo Contrast  11/06/2015  CLINICAL DATA:  Sudden onset of blurred vision in the left eye, migraine headache. EXAM: CT HEAD WITHOUT CONTRAST TECHNIQUE: Contiguous axial images were obtained from the base of the skull through the vertex without intravenous contrast. COMPARISON:  None. FINDINGS: No acute intracranial abnormality. Specifically, no hemorrhage,  hydrocephalus, mass lesion, acute infarction, or significant intracranial injury. No acute calvarial abnormality. Visualized paranasal sinuses and mastoids clear. Orbital soft tissues unremarkable. IMPRESSION: Negative study. Electronically Signed   By: Rolm Baptise M.D.   On: 11/06/2015 12:37   I have personally reviewed and evaluated these images and lab results as part of my medical decision-making.  After treatment in the ED the patient feels back to baseline and wants to go home.  MDM   Final diagnoses:  Other migraine without status migrainosus, not intractable        Leonard Schwartz, MD 11/06/15 1520

## 2015-11-06 NOTE — ED Notes (Signed)
Pt given d/c instructions as per chart. Verbalizes understanding. No questions. 

## 2015-11-06 NOTE — Discharge Instructions (Signed)

## 2015-11-07 NOTE — Telephone Encounter (Signed)
Appointment with them first available [facility]

## 2015-11-07 NOTE — Telephone Encounter (Signed)
FYI Dr.Fernadez I received a fax back from [facility] regarding new patient appointment and the doctor reviewed records and pt will be scheduled within 6 months. There office uses a internal rating system scale 1-4, (1 being urgent with 4 being least urgent) and pt rating was a 4 after review her records.

## 2015-11-07 NOTE — Telephone Encounter (Signed)
I called Linda Winters and they informed pt that they will be contacting her with 6 months to schedule

## 2015-11-21 ENCOUNTER — Telehealth: Payer: Self-pay | Admitting: *Deleted

## 2015-11-21 MED ORDER — FLUCONAZOLE 150 MG PO TABS: 150.0000 mg | ORAL_TABLET | Freq: Once | ORAL | Status: DC

## 2015-11-21 NOTE — Telephone Encounter (Signed)
Diflucan 150mg 1 dose

## 2015-11-21 NOTE — Telephone Encounter (Signed)
rx sent pt aware 

## 2015-11-21 NOTE — Telephone Encounter (Signed)
(  dr. Toney Rakes patient ) Pt was treated for UTI on 10/29/15 c/o vaginal itching and white discharge, asked if Rx could be sent to help with this? Please advise

## 2016-03-14 ENCOUNTER — Ambulatory Visit (INDEPENDENT_AMBULATORY_CARE_PROVIDER_SITE_OTHER): Payer: BLUE CROSS/BLUE SHIELD | Admitting: Gynecology

## 2016-03-14 ENCOUNTER — Encounter: Payer: Self-pay | Admitting: Gynecology

## 2016-03-14 VITALS — BP 128/86

## 2016-03-14 DIAGNOSIS — N941 Unspecified dyspareunia: Secondary | ICD-10-CM | POA: Diagnosis not present

## 2016-03-14 DIAGNOSIS — N946 Dysmenorrhea, unspecified: Secondary | ICD-10-CM | POA: Diagnosis not present

## 2016-03-14 DIAGNOSIS — N920 Excessive and frequent menstruation with regular cycle: Secondary | ICD-10-CM | POA: Diagnosis not present

## 2016-03-14 DIAGNOSIS — Z8742 Personal history of other diseases of the female genital tract: Secondary | ICD-10-CM | POA: Diagnosis not present

## 2016-03-14 LAB — URINALYSIS W MICROSCOPIC + REFLEX CULTURE
Bilirubin Urine: NEGATIVE
CRYSTALS: NONE SEEN [HPF]
Casts: NONE SEEN [LPF]
GLUCOSE, UA: NEGATIVE
KETONES UR: NEGATIVE
LEUKOCYTES UA: NEGATIVE
NITRITE: NEGATIVE
PH: 7.5 (ref 5.0–8.0)
Specific Gravity, Urine: 1.02 (ref 1.001–1.035)
WBC UA: NONE SEEN WBC/HPF (ref <=5)
Yeast: NONE SEEN [HPF]

## 2016-03-14 NOTE — Addendum Note (Signed)
Addended by: Thurnell Garbe A on: 03/14/2016 02:04 PM   Modules accepted: Orders

## 2016-03-14 NOTE — Progress Notes (Signed)
   HPI: Patient is a 40 year old who presented to the office today with complaints of worsening heavier menstrual cycles lasting pipe 5 days and passage of large clots. Sometimes she has to wear not only a tampon but also sanitary napkin which she will change every 3 hours. She has past history of endometriosis and has had 2 laparoscopies. She's currently not using any form of contraception. She does suffer from low abdominal pelvic pains as well as dyspareunia. Patient also has had history of mild cervical dysplasia but most recent Pap smear was normal. See previous notes for details. She denies any GU or GI complaints. She had tried a 20 g low dose oral contraceptive pill but it upset her stomach and she had issues with compliance.   ROS: A ROS was performed and pertinent positives and negatives are included in the history.  GENERAL: No fevers or chills. HEENT: No change in vision, no earache, sore throat or sinus congestion. NECK: No pain or stiffness. CARDIOVASCULAR: No chest pain or pressure. No palpitations. PULMONARY: No shortness of breath, cough or wheeze. GASTROINTESTINAL: No abdominal pain, nausea, vomiting or diarrhea, melena or bright red blood per rectum. GENITOURINARY: No urinary frequency, urgency, hesitancy or dysuria. MUSCULOSKELETAL: No joint or muscle pain, no back pain, no recent trauma. DERMATOLOGIC: No rash, no itching, no lesions. ENDOCRINE: No polyuria, polydipsia, no heat or cold intolerance. No recent change in weight. HEMATOLOGICAL: see above. NEUROLOGIC: No headache, seizures, numbness, tingling or weakness. PSYCHIATRIC: No depression, no loss of interest in normal activity or change in sleep pattern.   [id number] pressure 128/86 Gen. appearance well-developed well-nourished female with the above-mentioned complaining Abdomen: Soft nontender no rebound or guarding Pelvic: Bartholin urethra Skene was within normal limits Vagina: No lesions or discharge Cervix: No lesions or  discharge I've her limits of normal nontender no masses or tenderness Adnexa: No palpable mass or tenderness Rectal exam not done    Assessment Plan:40 year old patient with dysmenorrhea, menorrhagia, pelvic pain, and dyspareunia with past history of endometriosis had tried oral contraceptive pill it could not tolerate. I've given her 2 options either the Mirena IUD to help with her heavy menstrual cycles and possibly help with endometriosis or to consider laparoscopic assisted vaginal hysterectomy with bilateral salpingectomy and ovarian conservation. Literature information was provided on both subject and we'll discuss further when she returns next week for more thorough ultrasound.    Greater than 50% of time was spent in counseling and coordinating care of this patient.   Time of consultatio15  Minutes.

## 2016-03-14 NOTE — Patient Instructions (Signed)
Laparoscopically Assisted Vaginal Hysterectomy A laparoscopically assisted vaginal hysterectomy (LAVH) is a surgical procedure to remove the uterus and cervix, and sometimes the ovaries and fallopian tubes. During an LAVH, some of the surgical removal is done through the vagina, and the rest is done through a few small surgical cuts (incisions) in the abdomen.  This procedure is usually considered in women when a vaginal hysterectomy is not an option. Your health care provider will discuss the risks and benefits of the different surgical techniques at your appointment. Generally, recovery time is faster and there are fewer complications after laparoscopic procedures than after open incisional procedures. LET YOUR HEALTH CARE PROVIDER KNOW ABOUT:   Any allergies you have.  All medicines you are taking, including vitamins, herbs, eye drops, creams, and over-the-counter medicines.  Previous problems you or members of your family have had with the use of anesthetics.  Any blood disorders you have.  Previous surgeries you have had.  Medical conditions you have. RISKS AND COMPLICATIONS Generally, this is a safe procedure. However, as with any procedure, complications can occur. Possible complications include:  Allergies to medicines.  Difficulty breathing.  Bleeding.  Infection.  Damage to other structures near your uterus and cervix. BEFORE THE PROCEDURE  Ask your health care provider about changing or stopping your regular medicines.  Take certain medicines, such as a colon-emptying preparation, as directed.  Do not eat or drink anything for at least 8 hours before your surgery.  Stop smoking if you smoke. Stopping will improve your health after surgery.  Arrange for a ride home after surgery and for help at home during recovery. PROCEDURE   An IV tube will be put into one of your veins in order to give you fluids and medicines.  You will receive medicines to relax you and  medicines that make you sleep (general anesthetic).  You may have a flexible tube (catheter) put into your bladder to drain urine.  You may have a tube put through your nose or mouth that goes into your stomach (nasogastric tube). The nasogastric tube removes digestive fluids and prevents you from feeling nauseated and from vomiting.  Tight-fitting (compression) stockings will be placed on your legs to promote circulation.  Three to four small incisions will be made in your abdomen. An incision also will be made in your vagina. Probes and tools will be inserted into the small incisions. The uterus and cervix are removed (and possibly your ovaries and fallopian tubes) through your vagina as well as through the small incisions that were made in the abdomen.  Your vagina is then sewn back to normal. AFTER THE PROCEDURE  You may have a liquid diet temporarily. You will most likely return to, and tolerate, your usual diet the day after surgery.  You will be passing urine through a catheter. It will be removed the day after surgery.  Your temperature, breathing rate, heart rate, blood pressure, and oxygen level will be monitored regularly.  You will still wear compression stockings on your legs until you are able to move around.  You will use a special device or do breathing exercises to keep your lungs clear.  You will be encouraged to walk as soon as possible.   This information is not intended to replace advice given to you by your health care provider. Make sure you discuss any questions you have with your health care provider.   Document Released: 08/28/2011 Document Revised: 09/29/2014 Document Reviewed: 03/24/2013 Elsevier Interactive Patient Education 2016 Elsevier   Inc.  

## 2016-03-17 ENCOUNTER — Ambulatory Visit: Payer: BLUE CROSS/BLUE SHIELD | Admitting: Gynecology

## 2016-03-17 ENCOUNTER — Other Ambulatory Visit: Payer: BLUE CROSS/BLUE SHIELD

## 2016-03-17 ENCOUNTER — Other Ambulatory Visit: Payer: Self-pay | Admitting: Gynecology

## 2016-03-17 LAB — URINE CULTURE: Colony Count: 25000

## 2016-03-17 MED ORDER — NITROFURANTOIN MONOHYD MACRO 100 MG PO CAPS: 100.0000 mg | ORAL_CAPSULE | Freq: Two times a day (BID) | ORAL | Status: DC

## 2016-04-04 ENCOUNTER — Ambulatory Visit: Payer: BLUE CROSS/BLUE SHIELD | Admitting: Gynecology

## 2016-04-04 ENCOUNTER — Ambulatory Visit (INDEPENDENT_AMBULATORY_CARE_PROVIDER_SITE_OTHER): Payer: BLUE CROSS/BLUE SHIELD | Admitting: Women's Health

## 2016-04-04 ENCOUNTER — Encounter: Payer: Self-pay | Admitting: Women's Health

## 2016-04-04 ENCOUNTER — Ambulatory Visit (INDEPENDENT_AMBULATORY_CARE_PROVIDER_SITE_OTHER): Payer: BLUE CROSS/BLUE SHIELD

## 2016-04-04 ENCOUNTER — Other Ambulatory Visit: Payer: BLUE CROSS/BLUE SHIELD

## 2016-04-04 VITALS — BP 126/80 | Ht 69.0 in | Wt 222.0 lb

## 2016-04-04 DIAGNOSIS — Z8742 Personal history of other diseases of the female genital tract: Secondary | ICD-10-CM | POA: Diagnosis not present

## 2016-04-04 DIAGNOSIS — N941 Unspecified dyspareunia: Secondary | ICD-10-CM

## 2016-04-04 DIAGNOSIS — N946 Dysmenorrhea, unspecified: Secondary | ICD-10-CM

## 2016-04-04 DIAGNOSIS — N920 Excessive and frequent menstruation with regular cycle: Secondary | ICD-10-CM

## 2016-04-04 NOTE — Progress Notes (Signed)
Patient ID: Linda Winters, female   DOB: 04/17/1976, 40 y.o.   MRN: [id number] Presents for ultrasound. Having regular monthly 5 day cycles with menorrhagia, dyspareunia and dysmenorrhea.  Exam: Appears well. Ultrasound: T/V images. Uterus anteverted homogeneous. Endometrium within normal limits. Right and left ovary normal. Negative cul-de-sac. No apparent mass right or left adnexal.  Normal GYN ultrasound Menorrhagia and dysmenorrhea  Plan: Options for menorrhagia reviewed. Mirena IUD information given will check coverage reviewed slight risk for infection, perforation or hemorrhage. Will schedule placement with next cycle with Dr. Toney Rakes if would like to proceed.

## 2016-04-04 NOTE — Patient Instructions (Signed)
Levonorgestrel intrauterine device (IUD) What is this medicine? LEVONORGESTREL IUD (LEE voe nor jes trel) is a contraceptive (birth control) device. The device is placed inside the uterus by a healthcare professional. It is used to prevent pregnancy and can also be used to treat heavy bleeding that occurs during your period. Depending on the device, it can be used for 3 to 5 years. This medicine may be used for other purposes; ask your health care provider or pharmacist if you have questions. What should I tell my health care provider before I take this medicine? They need to know if you have any of these conditions: -abnormal Pap smear -cancer of the breast, uterus, or cervix -diabetes -endometritis -genital or pelvic infection now or in the past -have more than one sexual partner or your partner has more than one partner -heart disease -history of an ectopic or tubal pregnancy -immune system problems -IUD in place -liver disease or tumor -problems with blood clots or take blood-thinners -use intravenous drugs -uterus of unusual shape -vaginal bleeding that has not been explained -an unusual or allergic reaction to levonorgestrel, other hormones, silicone, or polyethylene, medicines, foods, dyes, or preservatives -pregnant or trying to get pregnant -breast-feeding How should I use this medicine? This device is placed inside the uterus by a health care professional. Talk to your pediatrician regarding the use of this medicine in children. Special care may be needed. Overdosage: If you think you have taken too much of this medicine contact a poison control center or emergency room at once. NOTE: This medicine is only for you. Do not share this medicine with others. What if I miss a dose? This does not apply. What may interact with this medicine? Do not take this medicine with any of the following medications: -amprenavir -bosentan -fosamprenavir This medicine may also interact with  the following medications: -aprepitant -barbiturate medicines for inducing sleep or treating seizures -bexarotene -griseofulvin -medicines to treat seizures like carbamazepine, ethotoin, felbamate, oxcarbazepine, phenytoin, topiramate -modafinil -pioglitazone -rifabutin -rifampin -rifapentine -some medicines to treat HIV infection like atazanavir, indinavir, lopinavir, nelfinavir, tipranavir, ritonavir -St. John's wort -warfarin This list may not describe all possible interactions. Give your health care provider a list of all the medicines, herbs, non-prescription drugs, or dietary supplements you use. Also tell them if you smoke, drink alcohol, or use illegal drugs. Some items may interact with your medicine. What should I watch for while using this medicine? Visit your doctor or health care professional for regular check ups. See your doctor if you or your partner has sexual contact with others, becomes HIV positive, or gets a sexual transmitted disease. This product does not protect you against HIV infection (AIDS) or other sexually transmitted diseases. You can check the placement of the IUD yourself by reaching up to the top of your vagina with clean fingers to feel the threads. Do not pull on the threads. It is a good habit to check placement after each menstrual period. Call your doctor right away if you feel more of the IUD than just the threads or if you cannot feel the threads at all. The IUD may come out by itself. You may become pregnant if the device comes out. If you notice that the IUD has come out use a backup birth control method like condoms and call your health care provider. Using tampons will not change the position of the IUD and are okay to use during your period. What side effects may I notice from receiving this medicine?   Side effects that you should report to your doctor or health care professional as soon as possible: -allergic reactions like skin rash, itching or  hives, swelling of the face, lips, or tongue -fever, flu-like symptoms -genital sores -high blood pressure -no menstrual period for 6 weeks during use -pain, swelling, warmth in the leg -pelvic pain or tenderness -severe or sudden headache -signs of pregnancy -stomach cramping -sudden shortness of breath -trouble with balance, talking, or walking -unusual vaginal bleeding, discharge -yellowing of the eyes or skin Side effects that usually do not require medical attention (report to your doctor or health care professional if they continue or are bothersome): -acne -breast pain -change in sex drive or performance -changes in weight -cramping, dizziness, or faintness while the device is being inserted -headache -irregular menstrual bleeding within first 3 to 6 months of use -nausea This list may not describe all possible side effects. Call your doctor for medical advice about side effects. You may report side effects to FDA at 1-800-FDA-1088. Where should I keep my medicine? This does not apply. NOTE: This sheet is a summary. It may not cover all possible information. If you have questions about this medicine, talk to your doctor, pharmacist, or health care provider.    2016, Elsevier/Gold Standard. (2011-10-09 13:54:04)  

## 2016-04-07 ENCOUNTER — Telehealth: Payer: Self-pay | Admitting: Gynecology

## 2016-04-07 NOTE — Telephone Encounter (Signed)
04/07/16-I LM VM for pt that her [facility] will cover the Mirena for contraception at 100%, no copay. Per Shea@BC [id number]

## 2016-05-24 DIAGNOSIS — N390 Urinary tract infection, site not specified: Secondary | ICD-10-CM | POA: Diagnosis not present

## 2016-09-11 ENCOUNTER — Other Ambulatory Visit: Payer: Self-pay | Admitting: Gynecology

## 2016-09-11 DIAGNOSIS — N63 Unspecified lump in unspecified breast: Secondary | ICD-10-CM

## 2016-09-14 DIAGNOSIS — J029 Acute pharyngitis, unspecified: Secondary | ICD-10-CM | POA: Diagnosis not present

## 2016-09-23 ENCOUNTER — Other Ambulatory Visit: Payer: BLUE CROSS/BLUE SHIELD

## 2016-10-08 ENCOUNTER — Other Ambulatory Visit: Payer: BLUE CROSS/BLUE SHIELD

## 2016-10-21 ENCOUNTER — Ambulatory Visit
Admission: RE | Admit: 2016-10-21 | Discharge: 2016-10-21 | Disposition: A | Discharge status: Home / Self Care | Payer: BLUE CROSS/BLUE SHIELD | Source: Ambulatory Visit | Attending: Gynecology | Admitting: Gynecology

## 2016-10-21 ENCOUNTER — Other Ambulatory Visit: Payer: Self-pay | Admitting: Gynecology

## 2016-10-21 DIAGNOSIS — N63 Unspecified lump in unspecified breast: Secondary | ICD-10-CM

## 2016-10-21 DIAGNOSIS — N644 Mastodynia: Secondary | ICD-10-CM

## 2016-10-21 DIAGNOSIS — N6322 Unspecified lump in the left breast, upper inner quadrant: Secondary | ICD-10-CM | POA: Diagnosis not present

## 2016-10-27 ENCOUNTER — Encounter: Payer: BLUE CROSS/BLUE SHIELD | Admitting: Gynecology

## 2016-11-12 IMAGING — DX DG CHEST 2V
2 series · 3 of 3 positions shown · non-contrast
Comparison: CT chest November 02, 2013

CLINICAL DATA: Severe LEFT chest discomfort radiating to LEFT arm,
numbness and tingling beginning a few hours ago. Former smoker.

EXAM:
CHEST  2 VIEW

[chest pa]
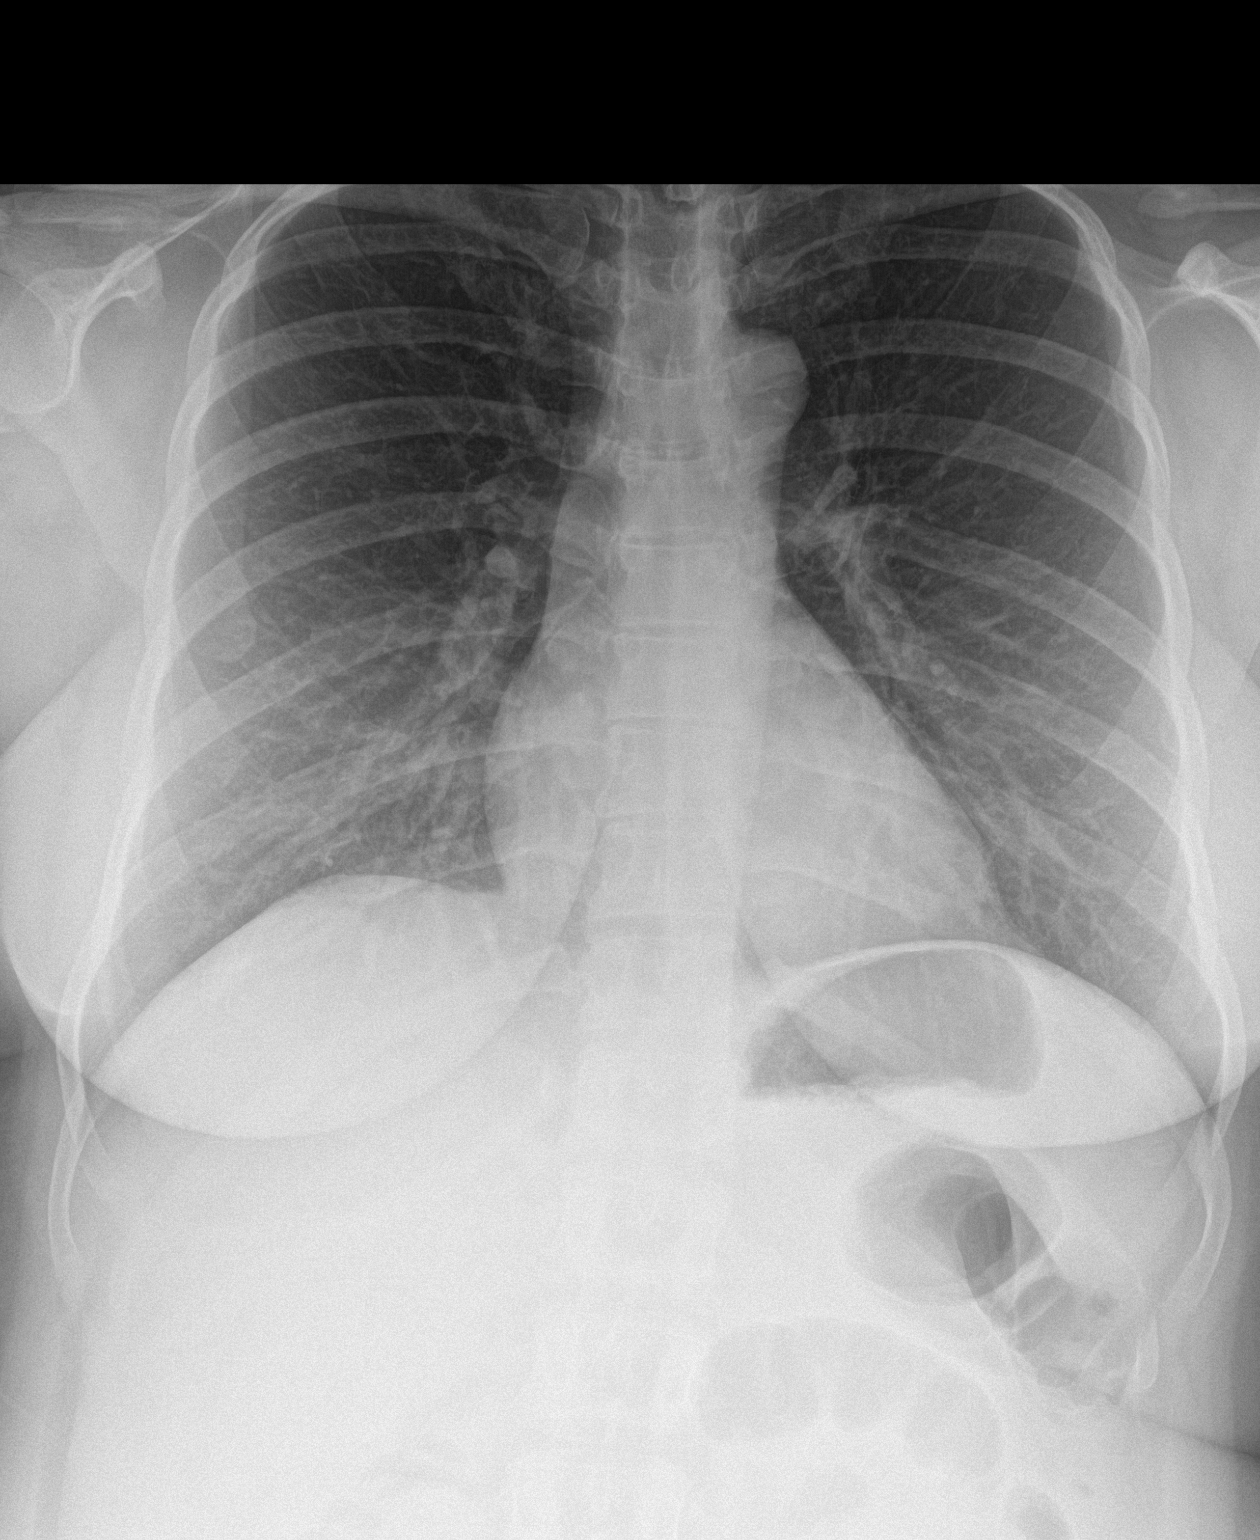

[Series 2: chest lat · 0.14mm/px · 2 of 2 slices shown]
[im 1/2]
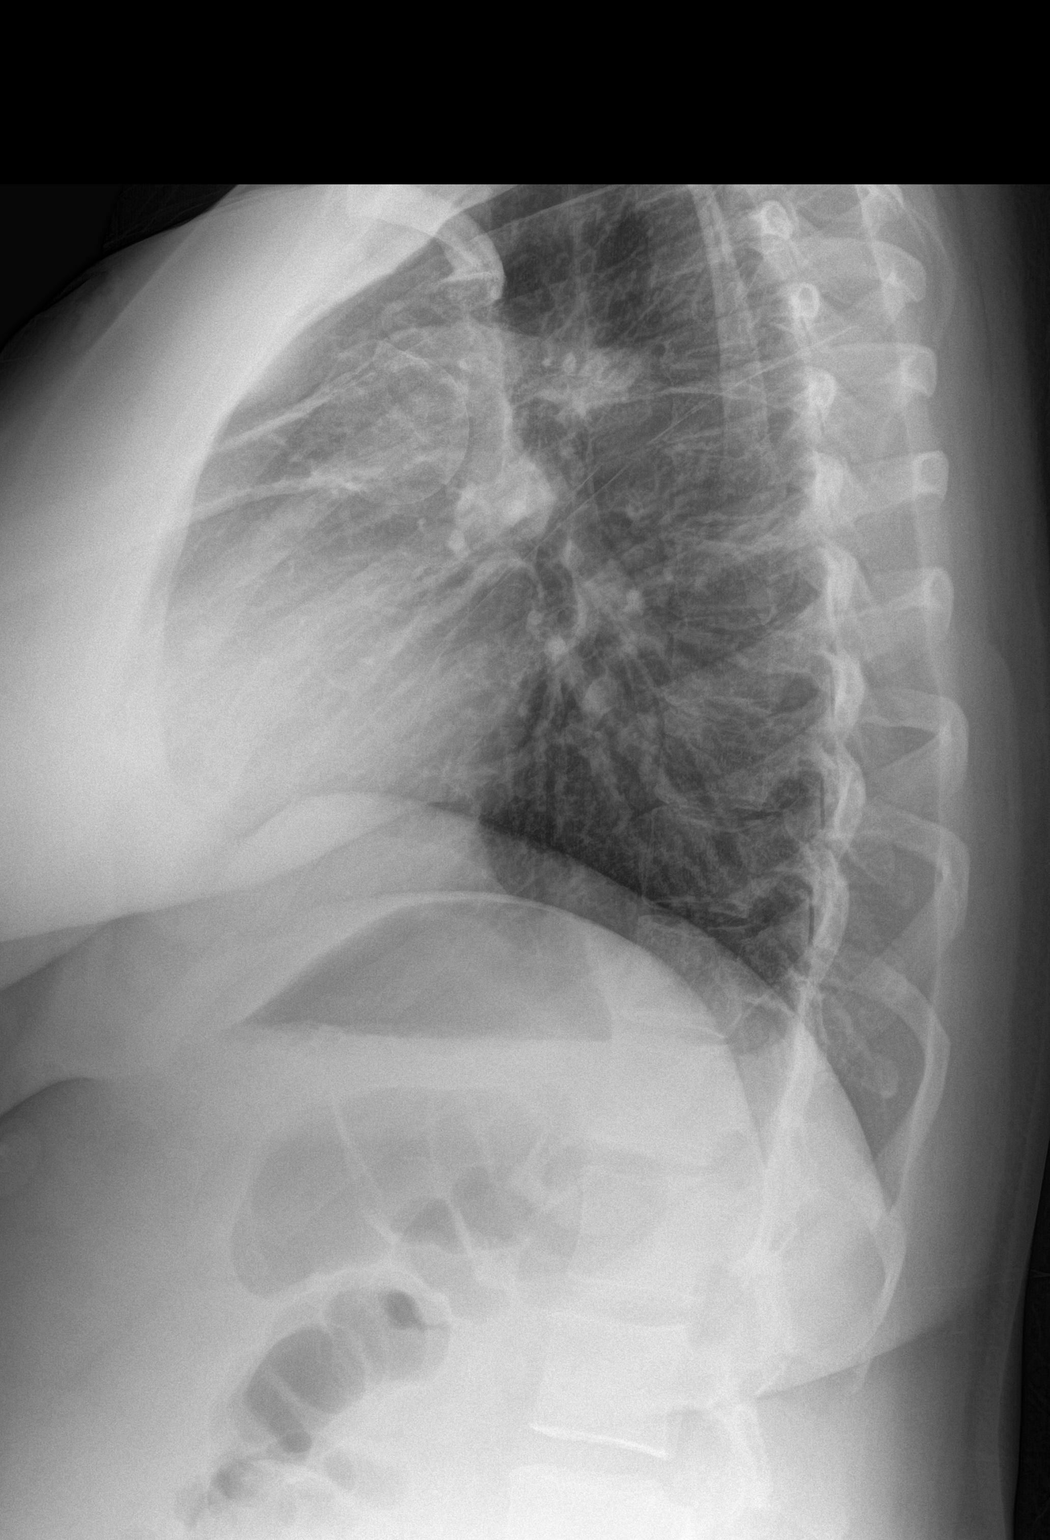
[im 2/2]
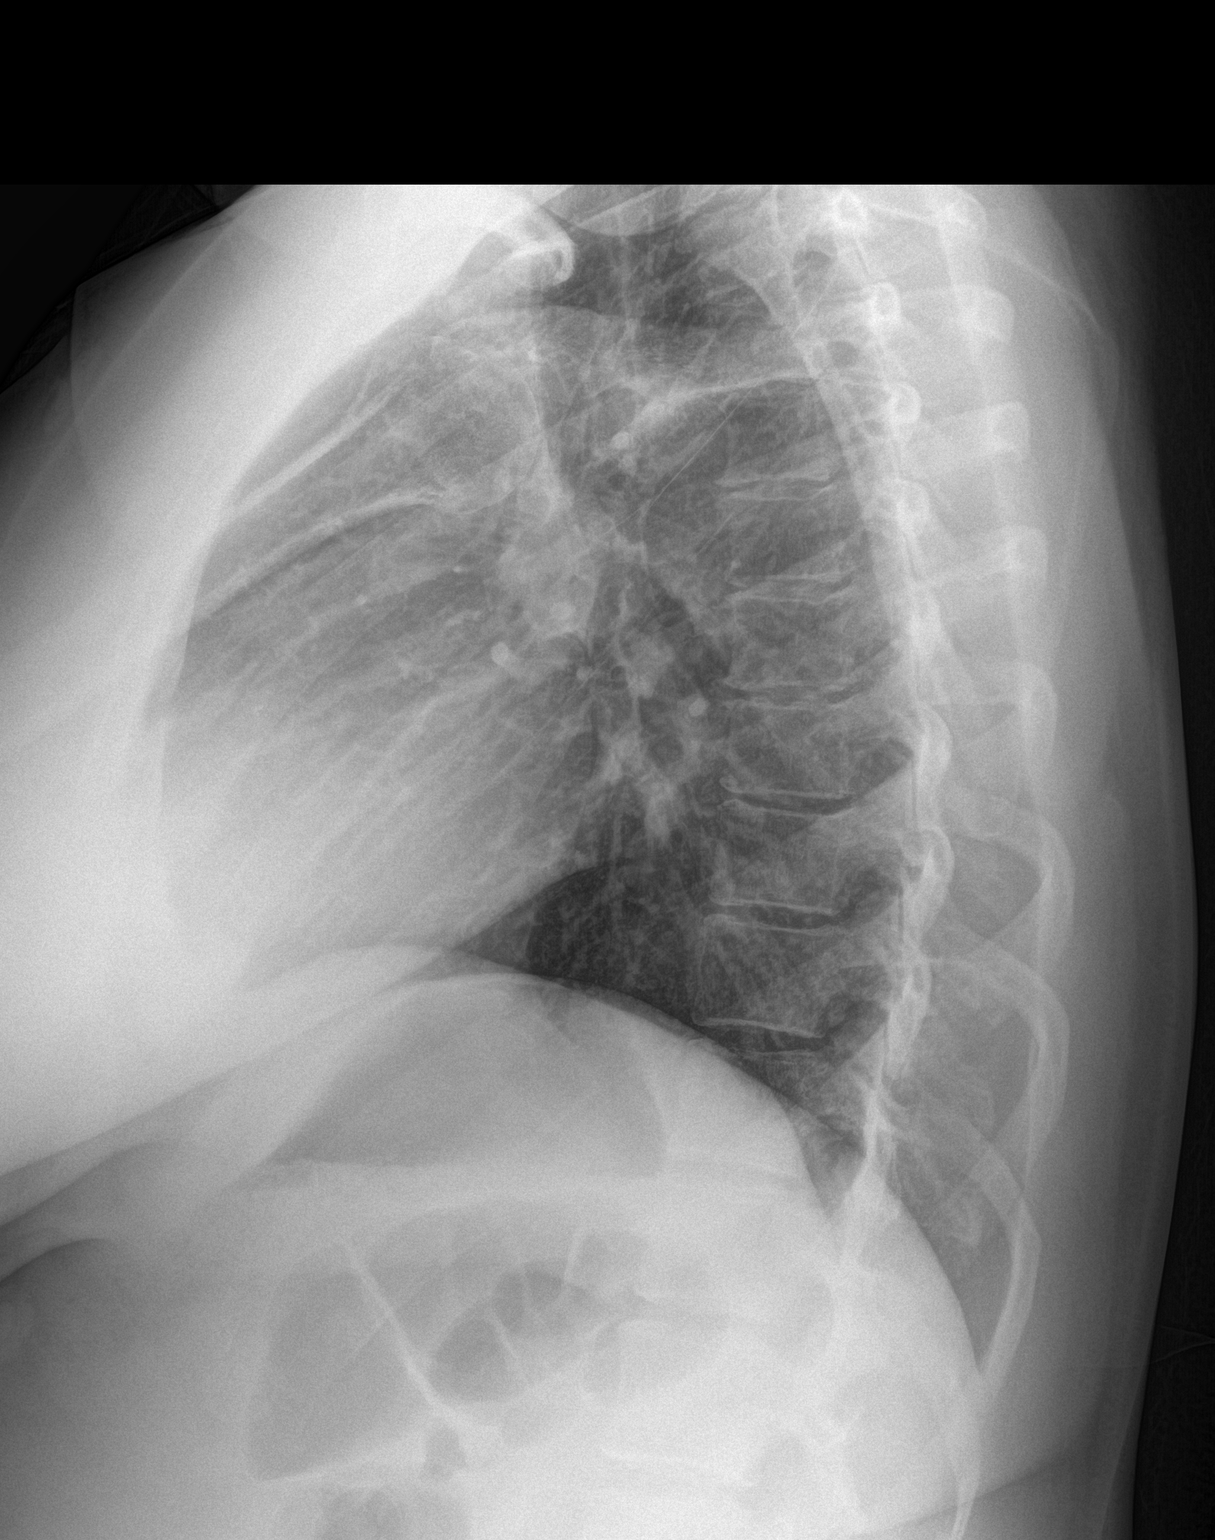

[3 of 3 positions shown; findings below may reference images not displayed]

FINDINGS: Cardiomediastinal silhouette is normal. The lungs are clear without
pleural effusions or focal consolidations. Trachea projects midline
and there is no pneumothorax. Soft tissue planes and included
osseous structures are non-suspicious.
IMPRESSION: Normal chest.

## 2016-12-29 ENCOUNTER — Encounter: Payer: BLUE CROSS/BLUE SHIELD | Admitting: Gynecology

## 2017-02-04 ENCOUNTER — Encounter: Payer: Self-pay | Admitting: Gynecology

## 2017-03-13 ENCOUNTER — Ambulatory Visit (INDEPENDENT_AMBULATORY_CARE_PROVIDER_SITE_OTHER): Payer: BLUE CROSS/BLUE SHIELD | Admitting: Gynecology

## 2017-03-13 ENCOUNTER — Encounter: Payer: Self-pay | Admitting: Gynecology

## 2017-03-13 VITALS — BP 122/78 | Ht 68.0 in | Wt 223.0 lb

## 2017-03-13 DIAGNOSIS — Z13220 Encounter for screening for lipoid disorders: Secondary | ICD-10-CM | POA: Diagnosis not present

## 2017-03-13 DIAGNOSIS — N809 Endometriosis, unspecified: Secondary | ICD-10-CM

## 2017-03-13 DIAGNOSIS — Z01419 Encounter for gynecological examination (general) (routine) without abnormal findings: Secondary | ICD-10-CM | POA: Diagnosis not present

## 2017-03-13 DIAGNOSIS — R102 Pelvic and perineal pain: Secondary | ICD-10-CM | POA: Diagnosis not present

## 2017-03-13 LAB — CBC WITH DIFFERENTIAL/PLATELET
BASOS ABS: 0 {cells}/uL (ref 0–200)
BASOS PCT: 0 %
EOS PCT: 1 %
Eosinophils Absolute: 116 cells/uL (ref 15–500)
HCT: 40.3 % (ref 35.0–45.0)
HEMOGLOBIN: 13.3 g/dL (ref 11.7–15.5)
LYMPHS ABS: 3712 {cells}/uL (ref 850–3900)
Lymphocytes Relative: 32 %
MCH: 28.6 pg (ref 27.0–33.0)
MCHC: 33 g/dL (ref 32.0–36.0)
MCV: 86.7 fL (ref 80.0–100.0)
MPV: 9.4 fL (ref 7.5–12.5)
Monocytes Absolute: 696 cells/uL (ref 200–950)
Monocytes Relative: 6 %
NEUTROS ABS: 7076 {cells}/uL (ref 1500–7800)
Neutrophils Relative %: 61 %
Platelets: 428 10*3/uL — ABNORMAL HIGH (ref 140–400)
RBC: 4.65 MIL/uL (ref 3.80–5.10)
RDW: 14.5 % (ref 11.0–15.0)
WBC: 11.6 10*3/uL — ABNORMAL HIGH (ref 3.8–10.8)

## 2017-03-13 LAB — COMPREHENSIVE METABOLIC PANEL
ALBUMIN: 4.5 g/dL (ref 3.6–5.1)
ALK PHOS: 79 U/L (ref 33–115)
ALT: 15 U/L (ref 6–29)
AST: 21 U/L (ref 10–30)
BILIRUBIN TOTAL: 0.6 mg/dL (ref 0.2–1.2)
BUN: 10 mg/dL (ref 7–25)
CO2: 25 mmol/L (ref 20–31)
CREATININE: 0.7 mg/dL (ref 0.50–1.10)
Calcium: 9.7 mg/dL (ref 8.6–10.2)
Chloride: 102 mmol/L (ref 98–110)
Glucose, Bld: 97 mg/dL (ref 65–99)
Potassium: 4.3 mmol/L (ref 3.5–5.3)
SODIUM: 138 mmol/L (ref 135–146)
TOTAL PROTEIN: 7.2 g/dL (ref 6.1–8.1)

## 2017-03-13 LAB — LIPID PANEL
CHOLESTEROL: 228 mg/dL — AB (ref <200)
HDL: 41 mg/dL — ABNORMAL LOW (ref >50)
LDL Cholesterol: 156 mg/dL — ABNORMAL HIGH (ref <100)
Total CHOL/HDL Ratio: 5.6 Ratio — ABNORMAL HIGH (ref <5.0)
Triglycerides: 155 mg/dL — ABNORMAL HIGH (ref <150)
VLDL: 31 mg/dL — ABNORMAL HIGH (ref <30)

## 2017-03-13 NOTE — Patient Instructions (Signed)
Follow-up for the ultrasound as scheduled. 

## 2017-03-13 NOTE — Progress Notes (Addendum)
Linda Winters 1976/07/30 [id number]        41 y.o.  [id number] for annual exam.  Normally sees Dr. Toney Rakes who is now retiring. 2 issues:  1. History of cervical dysplasia. Per Dr. Roberts Gaudy note LGSIL with positive high-risk HPV 2012. Colposcopy with biopsy showed LGSIL with negative ECC. Followed expectantly with most recent Pap smears negative 2013, 2014, 2015 and most recently negative Pap smear with negative HPV 2017. 2. Worsening pelvic pain and dysmenorrhea. History of laparoscopy 2 with endometriosis. Menses are becoming more painful and heavier. Also having pain between her menses to include dyspareunia with every coital episode. Had discussed options with Dr. Toney Rakes in the past to include hormonal suppression, Mirena IUD and surgery. Patient had trial of low-dose oral contraceptives but did not tolerate them due to nausea. She wants to discuss proceeding with hysterectomy. Status post vaginal delivery 2. Currently not using contraception feeling that she could not get pregnant which is not an issue for her.  Past medical history,surgical history, problem list, medications, allergies, family history and social history were all reviewed and documented as reviewed in the EPIC chart.  ROS:  Performed with pertinent positives and negatives included in the history, assessment and plan.   Additional significant findings :  None   Exam: [name] assistant Vitals:   03/13/17 0926  BP: 122/78  Weight: 223 lb (101.2 kg)  Height: 5\' 8"  (1.727 m)   Body mass index is 33.91 kg/m.  General appearance:  Normal affect, orientation and appearance. Skin: Grossly normal HEENT: Without gross lesions.  No cervical or supraclavicular adenopathy. Thyroid normal.  Lungs:  Clear without wheezing, rales or rhonchi Cardiac: RR, without RMG Abdominal:  Soft, nontender, without masses, guarding, rebound, organomegaly or hernia Breasts:  Examined lying and sitting without masses, retractions,  discharge or axillary adenopathy. Pelvic:  Ext, BUS, Vagina: Normal  Cervix: Normal. Tenaculum test with some descent  Uterus: Anteverted, normal size, shape and contour, midline and mobile nontender   Adnexa: Without masses or tenderness    Anus and perineum: Normal   Rectovaginal: Normal sphincter tone without palpated masses or tenderness.    Assessment/Plan:  41 y.o. [id number] female for annual exam with regular menses, no contraception.   1. Worsening dysmenorrhea, menorrhagia, pelvic pain with history of endometriosis. Options for management were reviewed to include hormonal manipulation with retrial of oral contraceptives, progesterone only, Depo-Lupron, Mirena IUD, repeat laparoscopy, hysterectomy with or without oophorectomy. Various approaches for the hysterectomy were discussed to include TAH, laparoscopic-assisted, robotic and vaginal. Tenaculum test today shows some descent and I think it would be reasonable to initiate a LAVH understanding and accepting the fall back on TAH. The ovarian conservation issue in a patient with endometriosis and pelvic pain was discussed. Leaving her ovaries at her young age for ongoing hormonal production with risks of recurrent/persistent pelvic pain requiring reoperation or other therapies versus removing both ovaries and dealing with hypoestrogenism and ERT discussed. Recommend starting with ultrasound now to rule out nonpalpable abnormality such as endometriomas and she will follow up for further discussion and decision as far as where will go from here. 2. History of cervical dysplasia. Last several Pap smears were normal to include a negative HPV. No Pap smear done today. Will plan less frequent screening interval per current screening guidelines. 3. Mammography 09/2016. SBE monthly reviewed. Breast exam normal today. Continue with annual mammograms when due. 4. Health maintenance. Patient requests screening lab work. CBC, CMP, lipid profile, urinalysis  due to pelvic pain all ordered. Follow up for ultrasound and decision as far as treatment options.   Anastasio Auerbach MD, 9:54 AM 03/13/2017

## 2017-03-14 LAB — URINALYSIS W MICROSCOPIC + REFLEX CULTURE
Bilirubin Urine: NEGATIVE
CASTS: NONE SEEN [LPF]
CRYSTALS: NONE SEEN [HPF]
Glucose, UA: NEGATIVE
Hgb urine dipstick: NEGATIVE
Ketones, ur: NEGATIVE
LEUKOCYTES UA: NEGATIVE
Nitrite: NEGATIVE
Specific Gravity, Urine: 1.024 (ref 1.001–1.035)
YEAST: NONE SEEN [HPF]
pH: 6 (ref 5.0–8.0)

## 2017-03-15 LAB — URINE CULTURE: ORGANISM ID, BACTERIA: NO GROWTH

## 2017-03-18 ENCOUNTER — Ambulatory Visit (INDEPENDENT_AMBULATORY_CARE_PROVIDER_SITE_OTHER): Payer: BLUE CROSS/BLUE SHIELD | Admitting: Gynecology

## 2017-03-18 ENCOUNTER — Encounter: Payer: Self-pay | Admitting: Gynecology

## 2017-03-18 ENCOUNTER — Ambulatory Visit (INDEPENDENT_AMBULATORY_CARE_PROVIDER_SITE_OTHER): Payer: BLUE CROSS/BLUE SHIELD

## 2017-03-18 VITALS — BP 124/82

## 2017-03-18 DIAGNOSIS — N946 Dysmenorrhea, unspecified: Secondary | ICD-10-CM

## 2017-03-18 DIAGNOSIS — R102 Pelvic and perineal pain: Secondary | ICD-10-CM

## 2017-03-18 DIAGNOSIS — N809 Endometriosis, unspecified: Secondary | ICD-10-CM

## 2017-03-18 NOTE — Patient Instructions (Signed)
Office will call you to arrange for surgery. 

## 2017-03-18 NOTE — Progress Notes (Signed)
    Linda Winters 1976/04/10 [id number]        41 y.o.  [id number] presents for follow up ultrasound. History of worsening dysmenorrhea and pelvic pain area and history of endometriosis in the past.  Past medical history,surgical history, problem list, medications, allergies, family history and social history were all reviewed and documented in the EPIC chart.  Directed ROS with pertinent positives and negatives documented in the history of present illness/assessment and plan.  Exam: Vitals:   03/18/17 0832  BP: 124/82   General appearance:  Normal  Ultrasound transvaginal shows uterus normal size and echotexture. Try layered endometrial echo noted measuring 14 mm. Right and left ovaries normal. Cul-de-sac negative  Assessment/Plan:  41 y.o. [id number] with worsening dysmenorrhea and pelvic pain. Ultrasound is normal without evidence of endometriomas or other pathology. History of endometriosis in the past. Laparoscopy 2. I again reviewed options with her as noted in my 03/13/2017 note. Patient wants to proceed with hysterectomy. We'll plan on LAVH with bilateral salpingectomies. I again reviewed the ovarian conservation issue and benefits as far as ongoing hormone production versus risks of pathology in the future such as stimulated endometriosis, other ovarian pathology up to including ovarian cancer all reviewed. Possibilities of reoperation discussed. Issues of hormone replacement and hypoestrogenism if ovaries removed. At this point the patient wants to keep both ovaries accepting the risks of ovarian disease in the future or endometriosis. Potential for TAH if issues arise during the surgery also reviewed. She has no barriers to transfusion if needed. Will go ahead and arrange for surgery and she'll represent for preoperative consult beforehand.    Anastasio Auerbach MD, 9:08 AM 03/18/2017

## 2017-03-19 ENCOUNTER — Other Ambulatory Visit: Payer: Self-pay | Admitting: Gynecology

## 2017-03-19 DIAGNOSIS — E7800 Pure hypercholesterolemia, unspecified: Secondary | ICD-10-CM

## 2017-03-19 DIAGNOSIS — E782 Mixed hyperlipidemia: Secondary | ICD-10-CM

## 2017-03-27 ENCOUNTER — Telehealth: Payer: Self-pay

## 2017-03-27 MED ORDER — PHENAZOPYRIDINE HCL 200 MG PO TABS: ORAL_TABLET | ORAL | 0 refills | Status: DC

## 2017-03-27 NOTE — Telephone Encounter (Signed)
I called patient to let her know I have the Sept schedule available now.  She needs to check her schedule and talk with husband and will call me back to schedule.

## 2017-03-27 NOTE — Telephone Encounter (Signed)
Added to addendum instead of using this encounter.

## 2017-03-27 NOTE — Telephone Encounter (Signed)
Patient called back and decided on 05/26/17.  Surgery schduled for this day. I explained to pt need for Pyridium night before surgery and am of surgery with sip of water and Rx was sent.  Front desk will call her to schedule pre op appt with Dr. Loetta Rough.

## 2017-03-27 NOTE — Addendum Note (Signed)
Addended by: Ramond Craver on: 03/27/2017 12:50 PM   Modules accepted: Orders

## 2017-04-02 ENCOUNTER — Encounter: Payer: Self-pay | Admitting: Gynecology

## 2017-05-08 DIAGNOSIS — Z0289 Encounter for other administrative examinations: Secondary | ICD-10-CM

## 2017-05-13 NOTE — Patient Instructions (Signed)
Your procedure is scheduled on:  Tuesday, Sept. 4, 2018  Enter through the [employer] [facility] at:  6:00 AM  Pick up the phone at the desk and dial [phone].  Call this number if you have problems the morning of surgery: [phone].  Remember: Do NOT eat food or drink after:  Midnight Monday  Take these medicines the morning of surgery with a SIP OF WATER:  Pyridium  Stop ALL herbal medications at this time  Do NOT smoke the day of surgery.  Do NOT wear jewelry (body piercing), metal hair clips/bobby pins, make-up, artifical eyelashes or nail polish. Do NOT wear lotions, powders, or perfumes.  You may wear deodorant. Do NOT shave for 48 hours prior to surgery. Do NOT bring valuables to the hospital. Contacts, dentures, or bridgework may not be worn into surgery.  Leave suitcase in car.  After surgery it may be brought to your room.  For patients admitted to the hospital, checkout time is 11:00 AM the day of discharge.  Bring a copy of your healthcare power of attorney and living will documents.

## 2017-05-14 ENCOUNTER — Encounter (HOSPITAL_COMMUNITY)
Admission: RE | Admit: 2017-05-14 | Discharge: 2017-05-14 | Disposition: A | Discharge status: Home / Self Care | Payer: BLUE CROSS/BLUE SHIELD | Source: Ambulatory Visit | Attending: Gynecology | Admitting: Gynecology

## 2017-05-14 ENCOUNTER — Encounter (HOSPITAL_COMMUNITY): Payer: Self-pay

## 2017-05-14 DIAGNOSIS — N946 Dysmenorrhea, unspecified: Secondary | ICD-10-CM | POA: Insufficient documentation

## 2017-05-14 DIAGNOSIS — Z01812 Encounter for preprocedural laboratory examination: Secondary | ICD-10-CM | POA: Diagnosis not present

## 2017-05-14 DIAGNOSIS — Z833 Family history of diabetes mellitus: Secondary | ICD-10-CM | POA: Insufficient documentation

## 2017-05-14 DIAGNOSIS — R232 Flushing: Secondary | ICD-10-CM | POA: Insufficient documentation

## 2017-05-14 DIAGNOSIS — N870 Mild cervical dysplasia: Secondary | ICD-10-CM | POA: Diagnosis not present

## 2017-05-14 DIAGNOSIS — N941 Unspecified dyspareunia: Secondary | ICD-10-CM | POA: Diagnosis not present

## 2017-05-14 DIAGNOSIS — N809 Endometriosis, unspecified: Secondary | ICD-10-CM | POA: Diagnosis not present

## 2017-05-14 DIAGNOSIS — N920 Excessive and frequent menstruation with regular cycle: Secondary | ICD-10-CM | POA: Diagnosis not present

## 2017-05-14 LAB — CBC
HCT: 38.8 % (ref 36.0–46.0)
HEMOGLOBIN: 13.6 g/dL (ref 12.0–15.0)
MCH: 29.6 pg (ref 26.0–34.0)
MCHC: 35.1 g/dL (ref 30.0–36.0)
MCV: 84.5 fL (ref 78.0–100.0)
Platelets: 370 10*3/uL (ref 150–400)
RBC: 4.59 MIL/uL (ref 3.87–5.11)
RDW: 13.7 % (ref 11.5–15.5)
WBC: 13 10*3/uL — ABNORMAL HIGH (ref 4.0–10.5)

## 2017-05-20 ENCOUNTER — Encounter: Payer: Self-pay | Admitting: Gynecology

## 2017-05-20 ENCOUNTER — Ambulatory Visit (INDEPENDENT_AMBULATORY_CARE_PROVIDER_SITE_OTHER): Payer: BLUE CROSS/BLUE SHIELD | Admitting: Gynecology

## 2017-05-20 VITALS — BP 124/78

## 2017-05-20 DIAGNOSIS — N809 Endometriosis, unspecified: Secondary | ICD-10-CM

## 2017-05-20 DIAGNOSIS — R102 Pelvic and perineal pain: Secondary | ICD-10-CM

## 2017-05-20 NOTE — H&P (Signed)
Linda Winters 1976/06/01 [id number]   History and Physical  Chief complaint: Endometriosis, dysmenorrhea, dyspareunia, menorrhagia  History of present illness: 41 y.o. [id number] with a history of endometriosis status post laparoscopy 2 with worsening dysmenorrhea, menorrhagia, dyspareunia.  Recent ultrasound shows normal-appearing uterus and normal right and left ovaries. Options for management were reviewed with the patient to include hormonal suppression such as oral contraceptives, progesterone only, Depo-Lupron, Mirena IUD, repeat laparoscopy, hysterectomy.  Patient wants to proceed with hysterectomy is admitted for LAVH bilateral salpingectomies.  Past medical history,surgical history, medications, allergies, family history and social history were all reviewed and documented in the EPIC chart.  ROS:  Was performed and pertinent positives and negatives are included in the history of present illness.  Exam:  [name] assistant Vitals:   05/20/17 0910  BP: 124/78   General: well developed, well nourished female, no acute distress HEENT: normal  Lungs: clear to auscultation without wheezing, rales or rhonchi  Cardiac: regular rate without rubs, murmurs or gallops  Abdomen: soft, nontender without masses, guarding, rebound, organomegaly  Pelvic: external bus vagina: normal   Cervix: grossly normal  Uterus: normal size, midline and mobile, nontender  Adnexa: without masses or tenderness    Assessment/Plan:  41 y.o. [id number] With worsening dysmenorrhea, dyspareunia, menorrhagia with history of laparoscopic proven endometriosis.  Recent ultrasound normal. Admitted for LAVH bilateral salpingectomies. I reviewed with the patient the proposed procedure. She understands that at any time during the procedure I may convert to an exploratory laparotomy if needed with a larger incision and a longer recovery period.  The absolute and irreversible sterility associated with hysterectomy was also  discussed. Sexuality following hysterectomy was reviewed and the potential for persistent dyspareunia and orgasmic dysfunction also discussed. The ovarian conservation issue was reviewed. The options to remove both ovaries or keep both ovaries was discussed. The patient desires to keep both ovaries for ongoing hormonal production. She understands by doing so she is at risk for ovarian disease in the future such as pain, cysts, endometriosis/endometriomas as well as ovarian cancer.  She understands and accepts this. She also gives me permission to remove one or both ovaries as an intraoperative decision if I feel this is the best course of action due to significant disease or other pathology encountered.  The expected intraoperative and postoperative courses as well as the recovery period were reviewed. The risks of infection, prolonged antibiotics, reoperation for abscess or hematoma formation was discussed. The risks of hemorrhage necessitating transfusion and the risks of transfusion reaction, hepatitis, HIV, mad cow disease and other unknown entities was also discussed. Incisional complications to include opening and draining of incisions and closure by secondary intention, dehiscence and long-term issues of keloid/cosmetics and hernia formation were reviewed. The risk of inadvertent injury to internal organs including bowel, bladder, ureters, vessels, nerves either immediately recognized or delay recognized necessitating major exploratory reparative surgeries and future reparative surgeries including bowel resection, ostomy formation, bladder repair, ureteral damage repair was discussed with her. She understands was no guarantee as far as pelvic pain relief and that her pain may persist, worsen, change or recur after the procedure. The patient's questions were answered to her satisfaction and she is ready to proceed with surgery.    Anastasio Auerbach MD, 9:33 AM  05/20/2017

## 2017-05-20 NOTE — Progress Notes (Signed)
Linda Winters 04/16/1976 [id number]   Preoperative consult  Chief complaint: Endometriosis, dysmenorrhea, dyspareunia, menorrhagia  History of present illness: 41 y.o. [id number] with a history of endometriosis status post laparoscopy 2 with worsening dysmenorrhea, menorrhagia, dyspareunia.  Recent ultrasound shows normal-appearing uterus and normal right and left ovaries. Options for management were reviewed with the patient to include hormonal suppression such as oral contraceptives, progesterone only, Depo-Lupron, Mirena IUD, repeat laparoscopy, hysterectomy.  Patient wants to proceed with hysterectomy is admitted for LAVH bilateral salpingectomies.  Past medical history,surgical history, medications, allergies, family history and social history were all reviewed and documented in the EPIC chart.  ROS:  Was performed and pertinent positives and negatives are included in the history of present illness.  Exam:  [name] assistant Vitals:   05/20/17 0910  BP: 124/78   General: well developed, well nourished female, no acute distress HEENT: normal  Lungs: clear to auscultation without wheezing, rales or rhonchi  Cardiac: regular rate without rubs, murmurs or gallops  Abdomen: soft, nontender without masses, guarding, rebound, organomegaly  Pelvic: external bus vagina: normal   Cervix: grossly normal  Uterus: normal size, midline and mobile, nontender  Adnexa: without masses or tenderness    Assessment/Plan:  41 y.o. [id number] With worsening dysmenorrhea, dyspareunia, menorrhagia with history of laparoscopic proven endometriosis.  Recent ultrasound normal. Admitted for LAVH bilateral salpingectomies. I reviewed with the patient the proposed procedure. She understands that at any time during the procedure I may convert to an exploratory laparotomy if needed with a larger incision and a longer recovery period.  The absolute and irreversible sterility associated with hysterectomy was also  discussed. Sexuality following hysterectomy was reviewed and the potential for persistent dyspareunia and orgasmic dysfunction also discussed. The ovarian conservation issue was reviewed. The options to remove both ovaries or keep both ovaries was discussed. The patient desires to keep both ovaries for ongoing hormonal production. She understands by doing so she is at risk for ovarian disease in the future such as pain, cysts, endometriosis/endometriomas as well as ovarian cancer.  She understands and accepts this. She also gives me permission to remove one or both ovaries as an intraoperative decision if I feel this is the best course of action due to significant disease or other pathology encountered.  The expected intraoperative and postoperative courses as well as the recovery period were reviewed. The risks of infection, prolonged antibiotics, reoperation for abscess or hematoma formation was discussed. The risks of hemorrhage necessitating transfusion and the risks of transfusion reaction, hepatitis, HIV, mad cow disease and other unknown entities was also discussed. Incisional complications to include opening and draining of incisions and closure by secondary intention, dehiscence and long-term issues of keloid/cosmetics and hernia formation were reviewed. The risk of inadvertent injury to internal organs including bowel, bladder, ureters, vessels, nerves either immediately recognized or delay recognized necessitating major exploratory reparative surgeries and future reparative surgeries including bowel resection, ostomy formation, bladder repair, ureteral damage repair was discussed with her. She understands was no guarantee as far as pelvic pain relief and that her pain may persist, worsen, change or recur after the procedure. The patient's questions were answered to her satisfaction and she is ready to proceed with surgery.    Anastasio Auerbach MD, 9:26 AM 05/20/2017

## 2017-05-20 NOTE — Patient Instructions (Signed)
Followup for surgery as scheduled. 

## 2017-05-22 NOTE — Anesthesia Preprocedure Evaluation (Signed)
Anesthesia Evaluation  Patient identified by MRN, date of birth, ID band Patient awake    Reviewed: Allergy & Precautions, H&P , Patient's Chart, lab work & pertinent test results, reviewed documented beta blocker date and time   Airway Mallampati: II  TM Distance: >3 FB Neck ROM: full    Dental no notable dental hx.    Pulmonary former smoker,    Pulmonary exam normal breath sounds clear to auscultation       Cardiovascular  Rhythm:regular Rate:Normal     Neuro/Psych    GI/Hepatic   Endo/Other    Renal/GU      Musculoskeletal   Abdominal   Peds  Hematology   Anesthesia Other Findings   Reproductive/Obstetrics                             Anesthesia Physical Anesthesia Plan  ASA: II  Anesthesia Plan: General   Post-op Pain Management:    Induction: Intravenous  PONV Risk Score and Plan: 2 and Ondansetron, Dexamethasone, Scopolamine patch - Pre-op and Midazolam  Airway Management Planned: Oral ETT  Additional Equipment:   Intra-op Plan:   Post-operative Plan: Extubation in OR  Informed Consent: I have reviewed the patients History and Physical, chart, labs and discussed the procedure including the risks, benefits and alternatives for the proposed anesthesia with the patient or authorized representative who has indicated his/her understanding and acceptance.   Dental Advisory Given  Plan Discussed with: CRNA and Surgeon  Anesthesia Plan Comments: (  )        Anesthesia Quick Evaluation

## 2017-05-26 ENCOUNTER — Ambulatory Visit (HOSPITAL_COMMUNITY)
Admission: RE | Admit: 2017-05-26 | Discharge: 2017-05-27 | Disposition: A | Discharge status: Home / Self Care | Payer: BLUE CROSS/BLUE SHIELD | Source: Ambulatory Visit | Attending: Gynecology | Admitting: Gynecology

## 2017-05-26 ENCOUNTER — Encounter (HOSPITAL_COMMUNITY): Payer: Self-pay

## 2017-05-26 ENCOUNTER — Ambulatory Visit (HOSPITAL_COMMUNITY): Payer: BLUE CROSS/BLUE SHIELD | Admitting: Anesthesiology

## 2017-05-26 ENCOUNTER — Encounter (HOSPITAL_COMMUNITY)
Admission: RE | Disposition: A | Discharge status: Home / Self Care | Payer: Self-pay | Source: Ambulatory Visit | Attending: Gynecology

## 2017-05-26 DIAGNOSIS — N879 Dysplasia of cervix uteri, unspecified: Secondary | ICD-10-CM | POA: Diagnosis not present

## 2017-05-26 DIAGNOSIS — N800 Endometriosis of uterus: Secondary | ICD-10-CM | POA: Diagnosis not present

## 2017-05-26 DIAGNOSIS — N809 Endometriosis, unspecified: Secondary | ICD-10-CM | POA: Diagnosis not present

## 2017-05-26 DIAGNOSIS — N946 Dysmenorrhea, unspecified: Secondary | ICD-10-CM | POA: Diagnosis not present

## 2017-05-26 DIAGNOSIS — R102 Pelvic and perineal pain: Secondary | ICD-10-CM | POA: Diagnosis not present

## 2017-05-26 DIAGNOSIS — Z87891 Personal history of nicotine dependence: Secondary | ICD-10-CM | POA: Diagnosis not present

## 2017-05-26 DIAGNOSIS — N838 Other noninflammatory disorders of ovary, fallopian tube and broad ligament: Secondary | ICD-10-CM | POA: Diagnosis not present

## 2017-05-26 DIAGNOSIS — N888 Other specified noninflammatory disorders of cervix uteri: Secondary | ICD-10-CM | POA: Diagnosis not present

## 2017-05-26 DIAGNOSIS — N920 Excessive and frequent menstruation with regular cycle: Secondary | ICD-10-CM | POA: Diagnosis not present

## 2017-05-26 HISTORY — PX: CYSTOSCOPY: SHX5120

## 2017-05-26 HISTORY — PX: LAPAROSCOPIC VAGINAL HYSTERECTOMY WITH SALPINGECTOMY: SHX6680

## 2017-05-26 LAB — HCG, SERUM, QUALITATIVE: Preg, Serum: NEGATIVE

## 2017-05-26 SURGERY — HYSTERECTOMY, VAGINAL, LAPAROSCOPY-ASSISTED, WITH SALPINGECTOMY
Anesthesia: General | Site: Bladder

## 2017-05-26 MED ORDER — ONDANSETRON HCL 4 MG/2ML IJ SOLN
INTRAMUSCULAR | Status: DC | PRN
  Administered 2017-05-26: 4 mg via INTRAVENOUS

## 2017-05-26 MED ORDER — LIDOCAINE HCL (CARDIAC) 20 MG/ML IV SOLN
INTRAVENOUS | Status: AC
  Filled 2017-05-26: qty 5

## 2017-05-26 MED ORDER — PROPOFOL 10 MG/ML IV BOLUS
INTRAVENOUS | Status: AC
  Filled 2017-05-26: qty 20

## 2017-05-26 MED ORDER — MIDAZOLAM HCL 2 MG/2ML IJ SOLN
INTRAMUSCULAR | Status: DC | PRN
  Administered 2017-05-26: 2 mg via INTRAVENOUS

## 2017-05-26 MED ORDER — GLYCOPYRROLATE 0.2 MG/ML IJ SOLN
INTRAMUSCULAR | Status: DC | PRN
  Administered 2017-05-26: 0.1 mg via INTRAVENOUS

## 2017-05-26 MED ORDER — CEFOTETAN DISODIUM-DEXTROSE 2-2.08 GM-% IV SOLR
2.0000 g | INTRAVENOUS | Status: AC
  Administered 2017-05-26: 2 g via INTRAVENOUS

## 2017-05-26 MED ORDER — ONDANSETRON HCL 4 MG/2ML IJ SOLN: 4.0000 mg | Freq: Four times a day (QID) | INTRAMUSCULAR | Status: DC | PRN

## 2017-05-26 MED ORDER — ROCURONIUM BROMIDE 100 MG/10ML IV SOLN
INTRAVENOUS | Status: AC
  Filled 2017-05-26: qty 1

## 2017-05-26 MED ORDER — DEXAMETHASONE SODIUM PHOSPHATE 10 MG/ML IJ SOLN
INTRAMUSCULAR | Status: AC
  Filled 2017-05-26: qty 1

## 2017-05-26 MED ORDER — KETOROLAC TROMETHAMINE 30 MG/ML IJ SOLN: 30.0000 mg | Freq: Four times a day (QID) | INTRAMUSCULAR | Status: DC

## 2017-05-26 MED ORDER — DEXTROSE-NACL 5-0.9 % IV SOLN
INTRAVENOUS | Status: DC
  Administered 2017-05-26: 17:00:00 via INTRAVENOUS

## 2017-05-26 MED ORDER — HYDROMORPHONE HCL 1 MG/ML IJ SOLN
0.2500 mg | INTRAMUSCULAR | Status: DC | PRN
  Administered 2017-05-26 (×2): 0.5 mg via INTRAVENOUS

## 2017-05-26 MED ORDER — SUGAMMADEX SODIUM 200 MG/2ML IV SOLN
INTRAVENOUS | Status: AC
  Filled 2017-05-26: qty 4

## 2017-05-26 MED ORDER — KETOROLAC TROMETHAMINE 30 MG/ML IJ SOLN
INTRAMUSCULAR | Status: AC
  Filled 2017-05-26: qty 1

## 2017-05-26 MED ORDER — DIPHENHYDRAMINE HCL 25 MG PO CAPS: 50.0000 mg | ORAL_CAPSULE | Freq: Four times a day (QID) | ORAL | Status: DC | PRN

## 2017-05-26 MED ORDER — CEFOTETAN DISODIUM-DEXTROSE 2-2.08 GM-% IV SOLR
INTRAVENOUS | Status: AC
  Filled 2017-05-26: qty 50

## 2017-05-26 MED ORDER — DEXAMETHASONE SODIUM PHOSPHATE 10 MG/ML IJ SOLN
INTRAMUSCULAR | Status: DC | PRN
  Administered 2017-05-26: 10 mg via INTRAVENOUS

## 2017-05-26 MED ORDER — HYDROMORPHONE HCL 1 MG/ML IJ SOLN
INTRAMUSCULAR | Status: AC
  Filled 2017-05-26: qty 0.5

## 2017-05-26 MED ORDER — SUGAMMADEX SODIUM 200 MG/2ML IV SOLN
INTRAVENOUS | Status: DC | PRN
  Administered 2017-05-26: 250 mg via INTRAVENOUS

## 2017-05-26 MED ORDER — OXYCODONE-ACETAMINOPHEN 5-325 MG PO TABS
1.0000 | ORAL_TABLET | ORAL | Status: DC | PRN
  Administered 2017-05-26 – 2017-05-27 (×4): 2 via ORAL
  Filled 2017-05-26 (×4): qty 2

## 2017-05-26 MED ORDER — BUPIVACAINE HCL (PF) 0.25 % IJ SOLN
INTRAMUSCULAR | Status: AC
  Filled 2017-05-26: qty 30

## 2017-05-26 MED ORDER — ONDANSETRON HCL 4 MG PO TABS: 4.0000 mg | ORAL_TABLET | Freq: Four times a day (QID) | ORAL | Status: DC | PRN

## 2017-05-26 MED ORDER — MORPHINE SULFATE (PF) 4 MG/ML IV SOLN
1.0000 mg | INTRAVENOUS | Status: DC | PRN
  Administered 2017-05-26: 4 mg via INTRAVENOUS
  Filled 2017-05-26: qty 1

## 2017-05-26 MED ORDER — STERILE WATER FOR IRRIGATION IR SOLN
Status: DC | PRN
  Administered 2017-05-26: 1000 mL

## 2017-05-26 MED ORDER — SCOPOLAMINE 1 MG/3DAYS TD PT72
1.0000 | MEDICATED_PATCH | Freq: Once | TRANSDERMAL | Status: DC
  Administered 2017-05-26: 1.5 mg via TRANSDERMAL

## 2017-05-26 MED ORDER — ONDANSETRON HCL 4 MG/2ML IJ SOLN
INTRAMUSCULAR | Status: AC
  Filled 2017-05-26: qty 2

## 2017-05-26 MED ORDER — MIDAZOLAM HCL 2 MG/2ML IJ SOLN
INTRAMUSCULAR | Status: AC
  Filled 2017-05-26: qty 2

## 2017-05-26 MED ORDER — DEXMEDETOMIDINE HCL IN NACL 200 MCG/50ML IV SOLN
INTRAVENOUS | Status: AC
  Filled 2017-05-26: qty 50

## 2017-05-26 MED ORDER — KETOROLAC TROMETHAMINE 30 MG/ML IJ SOLN
INTRAMUSCULAR | Status: DC | PRN
  Administered 2017-05-26: 30 mg via INTRAVENOUS

## 2017-05-26 MED ORDER — BUPIVACAINE HCL (PF) 0.25 % IJ SOLN
INTRAMUSCULAR | Status: DC | PRN
  Administered 2017-05-26: 8 mL

## 2017-05-26 MED ORDER — LACTATED RINGERS IV SOLN
INTRAVENOUS | Status: DC
  Administered 2017-05-26: 125 mL/h via INTRAVENOUS
  Administered 2017-05-26 (×2): via INTRAVENOUS

## 2017-05-26 MED ORDER — FENTANYL CITRATE (PF) 250 MCG/5ML IJ SOLN
INTRAMUSCULAR | Status: AC
  Filled 2017-05-26: qty 5

## 2017-05-26 MED ORDER — LIDOCAINE-EPINEPHRINE 1 %-1:100000 IJ SOLN
INTRAMUSCULAR | Status: AC
  Filled 2017-05-26: qty 1

## 2017-05-26 MED ORDER — SODIUM CHLORIDE 0.9 % IR SOLN
Status: DC | PRN
  Administered 2017-05-26: 3000 mL

## 2017-05-26 MED ORDER — ACETAMINOPHEN 160 MG/5ML PO SOLN
ORAL | Status: AC
  Administered 2017-05-26: 975 mg via ORAL
  Filled 2017-05-26: qty 40.6

## 2017-05-26 MED ORDER — FENTANYL CITRATE (PF) 100 MCG/2ML IJ SOLN
INTRAMUSCULAR | Status: DC | PRN
  Administered 2017-05-26: 100 ug via INTRAVENOUS
  Administered 2017-05-26 (×2): 50 ug via INTRAVENOUS

## 2017-05-26 MED ORDER — KETOROLAC TROMETHAMINE 30 MG/ML IJ SOLN
30.0000 mg | Freq: Four times a day (QID) | INTRAMUSCULAR | Status: DC
  Administered 2017-05-26 – 2017-05-27 (×3): 30 mg via INTRAVENOUS
  Filled 2017-05-26 (×3): qty 1

## 2017-05-26 MED ORDER — LIDOCAINE-EPINEPHRINE 1 %-1:100000 IJ SOLN
INTRAMUSCULAR | Status: DC | PRN
  Administered 2017-05-26: 10 mL

## 2017-05-26 MED ORDER — SCOPOLAMINE 1 MG/3DAYS TD PT72
MEDICATED_PATCH | TRANSDERMAL | Status: AC
  Administered 2017-05-26: 1.5 mg via TRANSDERMAL
  Filled 2017-05-26: qty 1

## 2017-05-26 MED ORDER — DEXMEDETOMIDINE HCL IN NACL 200 MCG/50ML IV SOLN
INTRAVENOUS | Status: DC | PRN
  Administered 2017-05-26: 100 ug/h via INTRAVENOUS

## 2017-05-26 MED ORDER — ACETAMINOPHEN 160 MG/5ML PO SOLN
975.0000 mg | Freq: Once | ORAL | Status: AC
Start: 2017-05-26 — End: 2017-05-26
  Administered 2017-05-26: 975 mg via ORAL

## 2017-05-26 MED ORDER — ROCURONIUM BROMIDE 100 MG/10ML IV SOLN
INTRAVENOUS | Status: DC | PRN
  Administered 2017-05-26: 50 mg via INTRAVENOUS
  Administered 2017-05-26: 20 mg via INTRAVENOUS

## 2017-05-26 MED ORDER — DEXAMETHASONE SODIUM PHOSPHATE 4 MG/ML IJ SOLN
INTRAMUSCULAR | Status: AC
  Filled 2017-05-26: qty 1

## 2017-05-26 MED ORDER — LIDOCAINE HCL (CARDIAC) 20 MG/ML IV SOLN
INTRAVENOUS | Status: DC | PRN
  Administered 2017-05-26: 100 mg via INTRAVENOUS

## 2017-05-26 MED ORDER — PROPOFOL 10 MG/ML IV BOLUS
INTRAVENOUS | Status: DC | PRN
  Administered 2017-05-26: 120 mg via INTRAVENOUS
  Administered 2017-05-26: 250 mg via INTRAVENOUS

## 2017-05-26 SURGICAL SUPPLY — 53 items
ADH SKN CLS APL DERMABOND .7 (GAUZE/BANDAGES/DRESSINGS) ×2
APL SRG 38 LTWT LNG FL B (MISCELLANEOUS)
APPLICATOR ARISTA FLEXITIP XL (MISCELLANEOUS) IMPLANT
CABLE HIGH FREQUENCY MONO STRZ (ELECTRODE) ×1 IMPLANT
CLOTH BEACON ORANGE TIMEOUT ST (SAFETY) ×3 IMPLANT
CONT PATH 16OZ SNAP LID 3702 (MISCELLANEOUS) ×3 IMPLANT
COVER BACK TABLE 60X90IN (DRAPES) ×3 IMPLANT
COVER MAYO STAND STRL (DRAPES) ×3 IMPLANT
DECANTER SPIKE VIAL GLASS SM (MISCELLANEOUS) ×6 IMPLANT
DERMABOND ADVANCED (GAUZE/BANDAGES/DRESSINGS) ×1
DERMABOND ADVANCED .7 DNX12 (GAUZE/BANDAGES/DRESSINGS) ×2 IMPLANT
DRSG OPSITE POSTOP 3X4 (GAUZE/BANDAGES/DRESSINGS) ×3 IMPLANT
DURAPREP 26ML APPLICATOR (WOUND CARE) ×3 IMPLANT
ELECT REM PT RETURN 9FT ADLT (ELECTROSURGICAL) ×3
ELECTRODE REM PT RTRN 9FT ADLT (ELECTROSURGICAL) ×2 IMPLANT
FILTER SMOKE EVAC LAPAROSHD (FILTER) ×3 IMPLANT
GLOVE BIO SURGEON STRL SZ7.5 (GLOVE) ×9 IMPLANT
GLOVE BIOGEL PI IND STRL 6.5 (GLOVE) ×2 IMPLANT
GLOVE BIOGEL PI IND STRL 7.0 (GLOVE) ×4 IMPLANT
GLOVE BIOGEL PI IND STRL 8 (GLOVE) ×4 IMPLANT
GLOVE BIOGEL PI INDICATOR 6.5 (GLOVE) ×1
GLOVE BIOGEL PI INDICATOR 7.0 (GLOVE) ×2
GLOVE BIOGEL PI INDICATOR 8 (GLOVE) ×2
GLOVE ECLIPSE 7.5 STRL STRAW (GLOVE) ×6 IMPLANT
HEMOSTAT ARISTA ABSORB 3G PWDR (MISCELLANEOUS) IMPLANT
LEGGING LITHOTOMY PAIR STRL (DRAPES) ×3 IMPLANT
NDL MAYO CATGUT SZ4 TPR NDL (NEEDLE) IMPLANT
NEEDLE MAYO CATGUT SZ4 (NEEDLE) IMPLANT
NS IRRIG 1000ML POUR BTL (IV SOLUTION) ×3 IMPLANT
PACK LAVH (CUSTOM PROCEDURE TRAY) ×3 IMPLANT
PACK ROBOTIC GOWN (GOWN DISPOSABLE) ×3 IMPLANT
PACK TRENDGUARD 450 HYBRID PRO (MISCELLANEOUS) IMPLANT
PAD OB MATERNITY 4.3X12.25 (PERSONAL CARE ITEMS) ×3 IMPLANT
POUCH LAPAROSCOPIC INSTRUMENT (MISCELLANEOUS) ×6 IMPLANT
PROTECTOR NERVE ULNAR (MISCELLANEOUS) ×6 IMPLANT
SCISSORS LAP 5X35 DISP (ENDOMECHANICALS) IMPLANT
SET CYSTO W/LG BORE CLAMP LF (SET/KITS/TRAYS/PACK) ×3 IMPLANT
SET IRRIG TUBING LAPAROSCOPIC (IRRIGATION / IRRIGATOR) ×3 IMPLANT
SHEARS HARMONIC ACE PLUS 36CM (ENDOMECHANICALS) ×3 IMPLANT
SLEEVE XCEL OPT CAN 5 100 (ENDOMECHANICALS) ×3 IMPLANT
SUT PLAIN 4 0 FS 2 27 (SUTURE) ×3 IMPLANT
SUT VIC AB 0 CT1 18XCR BRD8 (SUTURE) ×4 IMPLANT
SUT VIC AB 0 CT1 36 (SUTURE) ×3 IMPLANT
SUT VIC AB 0 CT1 8-18 (SUTURE) ×9
SUT VICRYL 0 TIES 12 18 (SUTURE) ×3 IMPLANT
SUT VICRYL 0 UR6 27IN ABS (SUTURE) ×3 IMPLANT
SYR BULB IRRIGATION 50ML (SYRINGE) ×3 IMPLANT
TOWEL OR 17X24 6PK STRL BLUE (TOWEL DISPOSABLE) ×6 IMPLANT
TRAY FOLEY CATH SILVER 14FR (SET/KITS/TRAYS/PACK) ×3 IMPLANT
TRENDGUARD 450 HYBRID PRO PACK (MISCELLANEOUS) ×3
TROCAR XCEL NON-BLD 11X100MML (ENDOMECHANICALS) ×3 IMPLANT
TROCAR XCEL NON-BLD 5MMX100MML (ENDOMECHANICALS) ×3 IMPLANT
WARMER LAPAROSCOPE (MISCELLANEOUS) ×6 IMPLANT

## 2017-05-26 NOTE — Transfer of Care (Signed)
Immediate Anesthesia Transfer of Care Note  Patient: Linda Winters  Procedure(s) Performed: Procedure(s) with comments: LAPAROSCOPIC ASSISTED VAGINAL HYSTERECTOMY WITH SALPINGECTOMY (Bilateral) - requests 7:30am OR time  requests 2 1/2 hours CYSTOSCOPY (N/A)  Patient Location: PACU  Anesthesia Type:General  Level of Consciousness: awake, sedated and patient cooperative  Airway & Oxygen Therapy: Patient Spontanous Breathing and Patient connected to nasal cannula oxygen  Post-op Assessment: Report given to RN, Post -op Vital signs reviewed and stable and Patient moving all extremities  Post vital signs: Reviewed and stable  Last Vitals:  Vitals:   05/26/17 0613  BP: (!) 143/82  Pulse: 88  Resp: 20  Temp: 36.9 C  SpO2: 99%    Last Pain:  Vitals:   05/26/17 0613  TempSrc: Oral      Patients Stated Pain Goal: 4 (35/32/99 2426)  Complications: No apparent anesthesia complications

## 2017-05-26 NOTE — Anesthesia Procedure Notes (Signed)
Procedure Name: Intubation Date/Time: 05/26/2017 7:38 AM Performed by: Hewitt Blade Pre-anesthesia Checklist: Patient identified, Emergency Drugs available, Suction available and Patient being monitored Patient Re-evaluated:Patient Re-evaluated prior to induction Oxygen Delivery Method: Circle system utilized Preoxygenation: Pre-oxygenation with 100% oxygen Induction Type: IV induction Ventilation: Mask ventilation without difficulty and Oral airway inserted - appropriate to patient size Laryngoscope Size: Glidescope and 3 Grade View: Grade I Tube type: Oral Tube size: 7.0 mm Number of attempts: 1 Airway Equipment and Method: Rigid stylet Placement Confirmation: ETT inserted through vocal cords under direct vision,  positive ETCO2 and breath sounds checked- equal and bilateral Secured at: 21 cm Tube secured with: Tape Dental Injury: Teeth and Oropharynx as per pre-operative assessment

## 2017-05-26 NOTE — H&P (Signed)
The patient was examined.  I reviewed the proposed surgery and consent form with the patient.  The dictated history and physical is current and accurate and all questions were answered. The patient is ready to proceed with surgery and has a realistic understanding and expectation for the outcome.   Anastasio Auerbach MD, 7:03 AM 05/26/2017

## 2017-05-26 NOTE — Op Note (Signed)
VOLLIE AARON 1976-01-08 [id number]   Post Operative Note   Date of surgery:  05/26/2017  Pre Op Dx:  History of endometriosis, dysmenorrhea, dyspareunia, menorrhagia  Post Op Dx:  History of endometriosis, dysmenorrhea, dyspareunia, menorrhagia  Procedure:  Laparoscopic-assisted vaginal hysterectomy, bilateral salpingectomies, cystoscopy  Surgeon:  Anastasio Auerbach  Assistant:  Sebastian Ache  Anesthesia:  General  EBL:  150 cc anesthesia reported  Complications:  None  Specimen:  Uterus with attached fallopian tubes to pathology  Findings: EUA:  External BUS vagina normal. Cervix normal. Uterus generous in size midline mobile. Adnexa without gross masses   Operative:  Anterior cul-de-sac normal. Posterior cul-de-sac normal. Uterus symmetrically enlarged without gross pathology. Right and left fallopian tubes normal length, caliber, fimbriated ends. Right and left ovaries grossly normal free and mobile. Pelvis without evidence of adhesions or pelvic endometriosis. Upper abdominal exam shows liver smooth. Gallbladder grossly normal. Appendix grossly normal, free and mobile.   Cystoscopy: Adequate, noting all aspects of the bladder were inspected showing no evidence of bladder mucosal injury and good bilateral ureteral jets, the patient having previously received preoperative Pyridium.  Procedure:  Patient was taken to the operating room, was placed in the low dorsal lithotomy position, underwent general anesthesia, received an abdominal/vaginal/perineal preparation per nursing personnel and an indwelling Foley catheter was placed in sterile technique. The timeout was performed by the surgical team. An EUA was performed and a Hulka tenaculum was subsequently placed on the cervix. The patient was draped in usual fashion. A transverse infraumbilical incision was made and using the 10 mm Optiview type direct entry trocar the abdomen was directly entered under direct visualization without  difficulty. The abdomen was then insufflated and right and left 5 mm ports were then placed under direct visualization after transillumination for the vessels without difficulty. Examination of the pelvic organs and upper abdominal exam was carried out with findings noted above. The right fallopian tube was elevated, the ureter identified away from the surgical site and the mesosalpinx was then transected using the harmonic scalpel with care to avoid the infundibulopelvic vessels. The uterine ovarian pedicle was then transected using the harmonic scalpel without difficulty and the round ligament was likewise transected without difficulty. The right parametrial peritoneal reflection was transected to the lower uterine segment using the harmonic scalpel and the anterior vesicouterine peritoneal fold was incised using the Harmonic scalpel to the midline. A similar procedure was carried out on the other side, meeting the vesicouterine peritoneal incision in the midline. Attention was then turned to the vaginal portion of the procedure and the patient was then placed in the high dorsal lithotomy position, the Hulka tenaculum removed, a weighted speculum placed and the cervix grasped with a tenaculum. The cervical mucosa was circumferentially injected using 1% lidocaine with 1:100,000 epinephrine dilution and the cervical mucosa was then circumferentially incised and the paracervical planes were sharply developed without difficulty. The posterior cul-de-sac was then sharply entered and a long weighted speculum was placed. The right and left uterosacral ligaments were then clamped cut and ligated using 0 Vicryl suture and tagged for future reference. The anterior cul-de-sac was progressively developed until ultimately entering the peritoneal cavity without difficulty. The parametrial tissues were then clamped cut and ligated bilaterally using 0 Vicryl suture to the level of the uterine vessels which were identified and  ligated. Due to the generous nature of the uterus the specimen was cored to allow collapsing of the uterus and facilitate pedicle identification. The remaining attachments were  then clamped cut and ligated using 0 Vicryl suture and the total specimen was removed. The intestines were then packed from the cul-de-sac using a tagged tail sponge, the long weighted speculum replaced with a shorter weighted speculum and the posterior vaginal cuff grasped with an Allis clamp. The posterior vaginal cuff was then run from uterosacral ligament to uterosacral ligament using 0 Vicryl suture in a running interlocking stitch. The bowel packing removed and the vagina was then closed anterior to posterior using 0 Vicryl suture in interrupted figure-of-eight stitch. The vagina was copiously irrigated showing adequate hemostasis. Attention was then turned to the cystoscopy. The Foley catheter was removed and cystoscopy was performed noting no evidence of bladder mucosal damage and bilateral ureteral jets. The Foley catheter was replaced, the surgical team regloved and regowned, the patient placed in low dorsal lithotomy position, the abdomen reinsufflated and subsequently irrigated. Several small bleeding points at the vaginal cuff were dressed using bipolar cautery. The surgical sites were then all reinspected under a low pressure situation showing maintained hemostasis and the 5 mm ports were then backed out under direct visualization showing adequate hemostasis. The infraumbilical port was backed out after evacuation of the gas under direct visualization showing adequate hemostasis and no evidence of hernia formation.  The skin incisions were injected using 0.25% Marcaine and the infraumbilical incision was closed using 0 Vicryl suture in an interrupted subcutaneous fascial stitch. The 5 mm ports were closed using 4-0 plain suture in a interrupted cutaneous stitch and the infraumbilical skin was reapproximated using 4-0 plain  suture in a running subcuticular stitch. Dermabond skin adhesive was placed infraumbilically and all skin incisions were covered with sterile dressings.  The patient received intraoperative Toradol. The sponge and needle count were verified correct. The pathologic specimen was verified correct.  Patient was then awakened without difficulty and taken to the recovery room in good condition having tolerated the procedure well.      Anastasio Auerbach MD, 9:39 AM 05/26/2017

## 2017-05-26 NOTE — Anesthesia Postprocedure Evaluation (Signed)
Anesthesia Post Note  Patient: Linda Winters  Procedure(s) Performed: Procedure(s) (LRB): LAPAROSCOPIC ASSISTED VAGINAL HYSTERECTOMY WITH SALPINGECTOMY (Bilateral) CYSTOSCOPY (N/A)     Patient location during evaluation: [facility] Anesthesia Type: General Level of consciousness: awake and alert and oriented Pain management: satisfactory to patient Vital Signs Assessment: post-procedure vital signs reviewed and stable Respiratory status: spontaneous breathing and respiratory function stable Cardiovascular status: stable Postop Assessment: adequate PO intake Anesthetic complications: no    Last Vitals:  Vitals:   05/26/17 1110 05/26/17 1215  BP: (!) 123/59 130/69  Pulse: 94 (!) 110  Resp: 18 17  Temp: 36.6 C 36.7 C  SpO2: 94% 97%    Last Pain:  Vitals:   05/26/17 1215  TempSrc: Oral  PainSc:    Pain Goal: Patients Stated Pain Goal: 4 (05/26/17 1840)               Katherina Mires

## 2017-05-26 NOTE — Progress Notes (Signed)
Patient ID: Linda Winters, female   DOB: 1976-06-29, 41 y.o.   MRN: [id number]   In to see patient VSS, urine clear Abd soft with minimal tenderness.  Dressings dry  Results of surgery discussed with the patient and family Continue routine PO care

## 2017-05-27 ENCOUNTER — Encounter (HOSPITAL_COMMUNITY): Payer: Self-pay | Admitting: Gynecology

## 2017-05-27 DIAGNOSIS — N920 Excessive and frequent menstruation with regular cycle: Secondary | ICD-10-CM | POA: Diagnosis not present

## 2017-05-27 DIAGNOSIS — N888 Other specified noninflammatory disorders of cervix uteri: Secondary | ICD-10-CM | POA: Diagnosis not present

## 2017-05-27 DIAGNOSIS — N879 Dysplasia of cervix uteri, unspecified: Secondary | ICD-10-CM | POA: Diagnosis not present

## 2017-05-27 DIAGNOSIS — N838 Other noninflammatory disorders of ovary, fallopian tube and broad ligament: Secondary | ICD-10-CM | POA: Diagnosis not present

## 2017-05-27 DIAGNOSIS — N800 Endometriosis of uterus: Secondary | ICD-10-CM | POA: Diagnosis not present

## 2017-05-27 DIAGNOSIS — Z87891 Personal history of nicotine dependence: Secondary | ICD-10-CM | POA: Diagnosis not present

## 2017-05-27 MED ORDER — OXYCODONE-ACETAMINOPHEN 5-325 MG PO TABS: 1.0000 | ORAL_TABLET | ORAL | 0 refills | Status: DC | PRN

## 2017-05-27 NOTE — Discharge Instructions (Signed)
°  Postoperative Instructions Hysterectomy ° °Dr. Samuel Rittenhouse and the nursing staff have discussed postoperative instructions with you.  If you have any questions please ask them before you leave the hospital, or call Dr Samarra Ridgely’s office at [phone].   ° °We would like to emphasize the following instructions: ° ° °  Call the office to make your follow-up appointment as recommended by Dr Gerrard Crystal (usually 2 weeks). ° °  You were given a prescription, or one was ordered for you at the pharmacy you designated.  Get that prescription filled and take the medication according to instructions. ° °  You may eat a regular diet, but slowly until you start having bowel movements. ° °  Drink plenty of water daily. ° °  Nothing in the vagina (intercourse, douching, objects of any kind) until released by Dr Walden Statz. ° °  No driving for two weeks.  Wait to be cleared by Dr Sima Lindenberger at your first post op check.  Car rides (short) are ok after several days at home, as long as you are not having significant pain, but no traveling out of town. ° °  You may shower, but no baths.  Walking up and down stairs is ok.  No heavy lifting, prolonged standing, repeated bending or any “working out” until your first post op check. ° °  Rest frequently, listen to your body and do not push yourself and overdo it. ° °  Call if: ° °o Your pain medication does not seem strong enough. °o Worsening pain or abdominal bloating °o Persistent nausea or vomiting °o Difficulty with urination or bowel movements. °o Temperature of 101 degrees or higher. °o Bleeding heavier then staining (clots or period type flow). °o Incisions become red, tender or begin to drain. °o You have any questions or concerns. °

## 2017-05-27 NOTE — Progress Notes (Signed)
Patient ID: Linda Winters, female   DOB: [birth year], 41 y.o.   MRN: [id number] Marciana L Elvin [birth year] [id number]   1 Day Post-Op Procedure(s) (LRB): LAPAROSCOPIC ASSISTED VAGINAL HYSTERECTOMY WITH SALPINGECTOMY (Bilateral) CYSTOSCOPY (N/A)  Subjective: Patient reports feels well, no complaints, pain severity reported mild, Yes.   taking PO, foley catheter out, Yes.   voiding, Yes.   ambulating, No. passing flatus  Objective: Vital signs in last 24 hours: Temp:  [97.8 F (36.6 C)-99 F (37.2 C)] 98.8 F (37.1 C) (09/05 0328) Pulse Rate:  [87-116] 94 (09/05 0328) Resp:  [17-22] 18 (09/05 0328) BP: (118-143)/(59-73) 118/69 (09/05 0328) SpO2:  [94 %-99 %] 99 % (09/05 0328) Weight:  [227 lb 6 oz (103.1 kg)] 227 lb 6 oz (103.1 kg) (09/04 1400) Last BM Date: 05/26/17    EXAM General: awake, alert and no distress Resp: clear to auscultation bilaterally Cardio: regular rate and rhythm GI: soft, minimal tender, bowel sounds present, incisions dry, intact Lower Extremities: Without swelling or tenderness Vaginal Bleeding: Reported scant   Lab Results:  No results for input(s): WBC, HGB, HCT, PLT in the last 72 hours.  Assessment: s/p Procedure(s): LAPAROSCOPIC ASSISTED VAGINAL HYSTERECTOMY WITH SALPINGECTOMY CYSTOSCOPY: progressing well, ready for discharge.    Plan: Discharge home today.  Precautions, instructions and follow up were discussed with the patient.  Prescriptions provided per AVS.  Patient to call the office to arrange a post-operative appointmant in 2 weeks.    Anastasio Auerbach MD, 7:16 AM 05/27/2017

## 2017-05-29 ENCOUNTER — Telehealth: Payer: Self-pay | Admitting: *Deleted

## 2017-05-29 ENCOUNTER — Ambulatory Visit (INDEPENDENT_AMBULATORY_CARE_PROVIDER_SITE_OTHER): Payer: BLUE CROSS/BLUE SHIELD | Admitting: Gynecology

## 2017-05-29 ENCOUNTER — Encounter: Payer: Self-pay | Admitting: Gynecology

## 2017-05-29 VITALS — BP 124/78

## 2017-05-29 DIAGNOSIS — N390 Urinary tract infection, site not specified: Secondary | ICD-10-CM

## 2017-05-29 MED ORDER — CIPROFLOXACIN HCL 250 MG PO TABS: 250.0000 mg | ORAL_TABLET | Freq: Two times a day (BID) | ORAL | 0 refills | Status: DC

## 2017-05-29 NOTE — Telephone Encounter (Signed)
Pt post LAVH on 05/26/17 c/o lots of pelvic pressure this am, frequent urination with small urine output, burning with urination, I advised pt OV best coming today at 3:00pm, front desk aware.

## 2017-05-29 NOTE — Addendum Note (Signed)
Addended by: Nelva Nay on: 05/29/2017 03:47 PM   Modules accepted: Orders

## 2017-05-29 NOTE — Progress Notes (Signed)
    Linda Winters 06-18-1976 [id number]        41 y.o.  [id number] presents having undergone LAVH bilateral salpingectomies 3 days ago. Awoke this morning with some dysuria and frequency. Has a history of UTIs in the past and feels like she is getting an early UTI. No nausea vomiting diarrhea constipation. No fever or chills. Eating, ambulating, voiding and having bowel movements.  Past medical history,surgical history, problem list, medications, allergies, family history and social history were all reviewed and documented in the EPIC chart.  Directed ROS with pertinent positives and negatives documented in the history of present illness/assessment and plan.  Exam: Vitals:   05/29/17 1523  BP: 124/78   General appearance:  Normal Abdomen soft nontender without masses guarding rebound. Incisions all healing nicely.  Assessment/Plan:  41 y.o. [id number] with symptoms and urinalysis to suggest early UTI. Will treat with ciprofloxacin 250 mg twice a day 7 days given her recent hospitalization. Will follow up if your symptoms persist, worsen or recur. Has appointment in 2 weeks for her postoperative visit and will follow up for this.    Anastasio Auerbach MD, 3:41 PM 05/29/2017

## 2017-05-29 NOTE — Patient Instructions (Signed)
Take antibiotic twice daily for 7 days

## 2017-05-30 LAB — URINALYSIS W MICROSCOPIC + REFLEX CULTURE
BILIRUBIN URINE: NEGATIVE
GLUCOSE, UA: NEGATIVE
Hyaline Cast: NONE SEEN /LPF
Ketones, ur: NEGATIVE
LEUKOCYTE ESTERASE: NEGATIVE
Nitrites, Initial: NEGATIVE
PROTEIN: NEGATIVE
Specific Gravity, Urine: 1.02 (ref 1.001–1.03)
pH: 6 (ref 5.0–8.0)

## 2017-05-30 LAB — NO CULTURE INDICATED

## 2017-06-08 ENCOUNTER — Telehealth: Payer: Self-pay

## 2017-06-08 MED ORDER — FLUCONAZOLE 150 MG PO TABS: 150.0000 mg | ORAL_TABLET | Freq: Once | ORAL | 0 refills | Status: AC

## 2017-06-08 NOTE — Telephone Encounter (Signed)
Okay for Diflucan 150 mg 1 dose 

## 2017-06-08 NOTE — Telephone Encounter (Signed)
Patient said she took antibiotic for UTI last week and now has vaginal yeast infection symptoms. Would like Rx if possible

## 2017-06-08 NOTE — Telephone Encounter (Signed)
Rx sent and patient informed.  

## 2017-06-09 ENCOUNTER — Encounter: Payer: Self-pay | Admitting: Gynecology

## 2017-06-09 ENCOUNTER — Ambulatory Visit (INDEPENDENT_AMBULATORY_CARE_PROVIDER_SITE_OTHER): Payer: BLUE CROSS/BLUE SHIELD | Admitting: Gynecology

## 2017-06-09 VITALS — BP 120/76

## 2017-06-09 DIAGNOSIS — R3915 Urgency of urination: Secondary | ICD-10-CM

## 2017-06-09 DIAGNOSIS — Z9889 Other specified postprocedural states: Secondary | ICD-10-CM

## 2017-06-09 NOTE — Progress Notes (Signed)
    Linda Winters [birth year] [id number]        41 y.o.  [id number] presents for postoperative visit status post LAVH bilateral salpingectomies 2 weeks ago. Notes some urinary urgency. Was treated for UTI 05/29/2017 although the lab did not ultimately culture out the specimen. She notes that the symptoms of dysuria and frequency have resolved. She does note some urgency that when she does go the bathroom she feels like she has to go quickly. No fever or chills low back pain frequency or dysuria at this point.  Past medical history,surgical history, problem list, medications, allergies, family history and social history were all reviewed and documented in the EPIC chart.  Directed ROS with pertinent positives and negatives documented in the history of present illness/assessment and plan.  Exam: Linda Winters assistant Vitals:   06/09/17 1425  BP: 120/76   General appearance:  Normal Abdomen soft nontender without masses guarding rebound. Incisions healing nicely. Pelvic external BUS vagina with cuff healing nicely. Bimanual exam without masses or tenderness.   Assessment/Plan:  41 y.o. [id number] with normal postoperative visit status post laparoscopic hysterectomy with bilateral salpingectomies. Having some urgency but no other symptoms. Urinalysis appears contaminated. Will culture and await for these results. Discussed possibly due to bladder manipulation during the surgery and that this should resolve on its own. Patient will slowly resume normal activities with the exception of strenuous activities and pelvic rest. Follow up in 2 weeks for next postoperative visit.    Anastasio Auerbach MD, 2:56 PM 06/09/2017

## 2017-06-09 NOTE — Addendum Note (Signed)
Addended by: Joaquin Music on: 06/09/2017 03:03 PM   Modules accepted: Orders

## 2017-06-09 NOTE — Patient Instructions (Signed)
Follow-up in 2 weeks for your next postoperative visit. 

## 2017-06-12 LAB — URINALYSIS W MICROSCOPIC + REFLEX CULTURE
Bilirubin Urine: NEGATIVE
GLUCOSE, UA: NEGATIVE
HYALINE CAST: NONE SEEN /LPF
Ketones, ur: NEGATIVE
LEUKOCYTE ESTERASE: NEGATIVE
Nitrites, Initial: NEGATIVE
PH: 5.5 (ref 5.0–8.0)
Specific Gravity, Urine: 1.022 (ref 1.001–1.03)

## 2017-06-12 LAB — TEST AUTHORIZATION

## 2017-06-12 LAB — URINE CULTURE
MICRO NUMBER: 81041318
SPECIMEN QUALITY: ADEQUATE

## 2017-06-12 LAB — NO CULTURE INDICATED

## 2017-06-23 ENCOUNTER — Ambulatory Visit (INDEPENDENT_AMBULATORY_CARE_PROVIDER_SITE_OTHER): Payer: BLUE CROSS/BLUE SHIELD | Admitting: Gynecology

## 2017-06-23 ENCOUNTER — Encounter: Payer: Self-pay | Admitting: Gynecology

## 2017-06-23 VITALS — BP 122/78

## 2017-06-23 DIAGNOSIS — Z09 Encounter for follow-up examination after completed treatment for conditions other than malignant neoplasm: Secondary | ICD-10-CM

## 2017-06-23 NOTE — Progress Notes (Signed)
    Linda Winters [birth year] [id number]        41 y.o.  [id number] presents for her 4 week postop visit status post LAVH bilateral salpingectomies. Doing well with no complaints. Not having any urinary issues.  Past medical history,surgical history, problem list, medications, allergies, family history and social history were all reviewed and documented in the EPIC chart.  Directed ROS with pertinent positives and negatives documented in the history of present illness/assessment and plan.  Exam: [name] assistant Vitals:   06/23/17 1457  BP: 122/78   General appearance:  Normal Abdomen soft nontender without masses guarding rebound. Incision sites all healed nicely. Pelvic external BUS vagina with cuff healing nicely. Bimanual exam without masses or tenderness  Assessment/Plan:  41 y.o. [id number] with normal postoperative visit. Patient will slowly resume all normal activities with the exception of pelvic rest for another 2-4 weeks. Assuming she continues well then she'll follow up in June 2019 when due for annual exam, sooner as needed.    Anastasio Auerbach MD, 3:17 PM 06/23/2017

## 2017-06-23 NOTE — Patient Instructions (Signed)
Slowly resume all normal activities with the exception of nothing in the vagina for the next 2-4 weeks.  Follow up in June treat her annual exam. Sooner if any issues.

## 2017-07-01 ENCOUNTER — Telehealth: Payer: Self-pay

## 2017-07-01 NOTE — Telephone Encounter (Signed)
Okay for 8 week's

## 2017-07-01 NOTE — Telephone Encounter (Signed)
Patient said she had planned to take 8 weeks out of work but noticed her paper work I completed was for 6 weeks. 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  Ok to extend her leave for total 8 weeks?

## 2017-07-13 ENCOUNTER — Telehealth: Payer: Self-pay

## 2017-07-13 NOTE — Telephone Encounter (Signed)
Disability rep called with questions. Had patient been seen since Oct 2 visit?  Any complications since that visit?  Does she have another scheduled post op appointment?  Any ER visit since 06/23/17?  She was told "no" to all these questions and I explained that patient had asked Dr. Loetta Rough about extending her absence 2 more weeks as she stands all day with retail work and Dr Loetta Rough felt that was reasonable for her to take two more weeks to rest and recover.

## 2017-07-14 ENCOUNTER — Encounter: Payer: Self-pay | Admitting: Gynecology

## 2017-07-15 ENCOUNTER — Telehealth: Payer: Self-pay | Admitting: *Deleted

## 2017-07-15 ENCOUNTER — Ambulatory Visit (INDEPENDENT_AMBULATORY_CARE_PROVIDER_SITE_OTHER): Payer: BLUE CROSS/BLUE SHIELD | Admitting: Gynecology

## 2017-07-15 ENCOUNTER — Telehealth: Payer: Self-pay

## 2017-07-15 ENCOUNTER — Encounter: Payer: Self-pay | Admitting: Gynecology

## 2017-07-15 VITALS — BP 122/82

## 2017-07-15 DIAGNOSIS — R102 Pelvic and perineal pain: Secondary | ICD-10-CM | POA: Diagnosis not present

## 2017-07-15 NOTE — Patient Instructions (Signed)
Call if your pelvic symptoms continue.  We will schedule an ultrasound at that time.

## 2017-07-15 NOTE — Telephone Encounter (Signed)
Patient called in voice mail stating that [employer] has denied her additional 2 weeks of leave.I left detailed message in voice mail and I explained to patient I filled out form and sent records as requested. Even spoke with a lady about details since her visit, ie. Any ER trips? Any complications? Any other appointments?  I told her I am happy to do additional forms as she mentioned there may be more.

## 2017-07-15 NOTE — Progress Notes (Signed)
    Linda Winters June 11, 1976 [id number]        41 y.o.  [id number] presents 7 weeks postop status post LAVH bilateral salpingectomies.  Notes having pelvic pressure and discomfort after activities.  Has not reinitiated intercourse.  No nausea, vomiting, diarrhea, constipation.  No fever or chills.  No urinary symptoms such as frequency, dysuria, urgency or low back pain.  Has started using a stationary bicycle and walking more.  Past medical history,surgical history, problem list, medications, allergies, family history and social history were all reviewed and documented in the EPIC chart.  Directed ROS with pertinent positives and negatives documented in the history of present illness/assessment and plan.  Exam: [name] assistant Vitals:   07/15/17 1029  BP: 122/82   General appearance:  Normal Spine straight without CVA tenderness. Abdomen soft nontender without masses guarding rebound Pelvic external BUS vagina normal.  Bimanual exam without palpable masses or significant discomfort.  No evidence of cuff hematoma although exam somewhat limited by abdominal girth.  Assessment/Plan:  41 y.o. [id number] with pelvic pressure and discomfort particularly after activities.  I suspect that this is more postop normal healing and patient may be pushing herself a little more at this point.  I reviewed the differential to include possible cuff hematoma.  More significant such as cuff abscess or other infectious etiologies reviewed.  She is otherwise doing well without bowel or bladder symptoms and no fever or chills.  At this point I recommended she pulled back a little bit and rest more with less strenuous activities.  Avoid intercourse for another several weeks.  If her symptoms would persist then she will call and I will start with a ultrasound.  I reviewed about scheduling it now but I think regardless even if we saw a small cuff hematoma I would not do anything interventional at this point.  Her urine  analysis today shows few bacteria, 0-5 squamous cells no significant WBC and 6-10 RBC.  Some mucus is also noted to suggest slight contamination.  Will follow up on culture and treat if positive.    Anastasio Auerbach MD, 11:01 AM 07/15/2017

## 2017-07-15 NOTE — Telephone Encounter (Signed)
Patient called post LAVH on 05/26/17 called c/o pelvic cramping sharp/dull achy with pressure. No burning, no urgency, states this discomfort started only when resuming back to normal activities, pt said she started exercising walking, walking dogs, riding exercise bike at home. I explained best to schedule visit for exam due to symptoms, will have front desk to schedule.

## 2017-07-15 NOTE — Addendum Note (Signed)
Addended by: Nelva Nay on: 07/15/2017 12:07 PM   Modules accepted: Orders

## 2017-07-17 LAB — URINE CULTURE
MICRO NUMBER: 81196628
RESULT: NO GROWTH
SPECIMEN QUALITY: ADEQUATE
Sample Source: 0

## 2017-07-17 LAB — URINALYSIS W MICROSCOPIC + REFLEX CULTURE
Bilirubin Urine: NEGATIVE
Glucose, UA: NEGATIVE
HYALINE CAST: NONE SEEN /LPF
Leukocyte Esterase: NEGATIVE
Nitrites, Initial: NEGATIVE
SPECIFIC GRAVITY, URINE: 1.029 (ref 1.001–1.03)
WBC, UA: NONE SEEN /HPF (ref 0–5)
pH: 5 (ref 5.0–8.0)

## 2017-07-17 LAB — TEST AUTHORIZATION

## 2017-07-17 LAB — NO CULTURE INDICATED

## 2017-10-15 ENCOUNTER — Other Ambulatory Visit: Payer: Self-pay | Admitting: Gynecology

## 2017-10-15 DIAGNOSIS — Z1231 Encounter for screening mammogram for malignant neoplasm of breast: Secondary | ICD-10-CM

## 2017-11-09 ENCOUNTER — Ambulatory Visit: Payer: BLUE CROSS/BLUE SHIELD

## 2017-11-10 ENCOUNTER — Ambulatory Visit
Admission: RE | Admit: 2017-11-10 | Discharge: 2017-11-10 | Disposition: A | Discharge status: Home / Self Care | Payer: BLUE CROSS/BLUE SHIELD | Source: Ambulatory Visit | Attending: Gynecology | Admitting: Gynecology

## 2017-11-10 ENCOUNTER — Other Ambulatory Visit: Payer: Self-pay | Admitting: Gynecology

## 2017-11-10 DIAGNOSIS — Z1231 Encounter for screening mammogram for malignant neoplasm of breast: Secondary | ICD-10-CM | POA: Diagnosis not present

## 2017-11-10 DIAGNOSIS — R928 Other abnormal and inconclusive findings on diagnostic imaging of breast: Secondary | ICD-10-CM

## 2017-11-11 ENCOUNTER — Other Ambulatory Visit: Payer: Self-pay | Admitting: Gynecology

## 2017-11-11 DIAGNOSIS — R928 Other abnormal and inconclusive findings on diagnostic imaging of breast: Secondary | ICD-10-CM

## 2017-11-13 ENCOUNTER — Ambulatory Visit
Admission: RE | Admit: 2017-11-13 | Discharge: 2017-11-13 | Disposition: A | Discharge status: Home / Self Care | Payer: BLUE CROSS/BLUE SHIELD | Source: Ambulatory Visit | Attending: Gynecology | Admitting: Gynecology

## 2017-11-13 DIAGNOSIS — N6489 Other specified disorders of breast: Secondary | ICD-10-CM | POA: Diagnosis not present

## 2017-11-13 DIAGNOSIS — R928 Other abnormal and inconclusive findings on diagnostic imaging of breast: Secondary | ICD-10-CM

## 2018-06-08 DIAGNOSIS — J019 Acute sinusitis, unspecified: Secondary | ICD-10-CM | POA: Diagnosis not present

## 2018-09-21 ENCOUNTER — Ambulatory Visit (INDEPENDENT_AMBULATORY_CARE_PROVIDER_SITE_OTHER): Payer: BLUE CROSS/BLUE SHIELD | Admitting: Gynecology

## 2018-09-21 ENCOUNTER — Encounter: Payer: Self-pay | Admitting: Gynecology

## 2018-09-21 VITALS — BP 118/76 | Ht 68.0 in | Wt 228.0 lb

## 2018-09-21 DIAGNOSIS — E7800 Pure hypercholesterolemia, unspecified: Secondary | ICD-10-CM | POA: Diagnosis not present

## 2018-09-21 DIAGNOSIS — Z01419 Encounter for gynecological examination (general) (routine) without abnormal findings: Secondary | ICD-10-CM | POA: Diagnosis not present

## 2018-09-21 NOTE — Patient Instructions (Signed)
Follow-up for fasting blood work as arranged.  Follow-up in 1 year for annual exam 

## 2018-09-21 NOTE — Progress Notes (Signed)
    Linda Winters Aug 25, 1976 [id number]        42 y.o.  [id number] for annual gynecologic exam.  Without gynecologic complaints  Past medical history,surgical history, problem list, medications, allergies, family history and social history were all reviewed and documented as reviewed in the EPIC chart.  ROS:  Performed with pertinent positives and negatives included in the history, assessment and plan.   Additional significant findings : None   Exam: [name] assistant Vitals:   09/21/18 1131  BP: 118/76  Weight: 228 lb (103.4 kg)  Height: 5\' 8"  (1.727 m)   Body mass index is 34.67 kg/m.  General appearance:  Normal affect, orientation and appearance. Skin: Grossly normal HEENT: Without gross lesions.  No cervical or supraclavicular adenopathy. Thyroid normal.  Lungs:  Clear without wheezing, rales or rhonchi Cardiac: RR, without RMG Abdominal:  Soft, nontender, without masses, guarding, rebound, organomegaly or hernia Breasts:  Examined lying and sitting without masses, retractions, discharge or axillary adenopathy. Pelvic:  Ext, BUS, Vagina: Normal  Adnexa: Without masses or tenderness    Anus and perineum: Normal   Rectovaginal: Normal sphincter tone without palpated masses or tenderness.    Assessment/Plan:  42 y.o. [id number] female for annual gynecologic exam status post LAVH bilateral salpingectomies.   1. Without gynecologic complaints.  Status post hysterectomy for bleeding. 2. Mammography coming due the beginning of next year and I reminded her to schedule it.  Breast exam normal today. 3. Pap smear/HPV 10/2015.  No Pap smear done today.  No history of significant abnormal Pap smears.  Plan repeat Pap smear/HPV at 5-year interval per current screening guidelines. 4. Health maintenance.  Requests baseline labs.  Will return for fasting CBC, CMP and lipid profile.  Future orders placed.  Had borderline cholesterol in the past.  Follow-up in 1 year, sooner as  needed.   Anastasio Auerbach MD, 12:00 PM 09/21/2018

## 2018-09-23 ENCOUNTER — Other Ambulatory Visit: Payer: BLUE CROSS/BLUE SHIELD

## 2018-09-23 DIAGNOSIS — E7800 Pure hypercholesterolemia, unspecified: Secondary | ICD-10-CM | POA: Diagnosis not present

## 2018-09-23 DIAGNOSIS — Z01419 Encounter for gynecological examination (general) (routine) without abnormal findings: Secondary | ICD-10-CM

## 2018-09-24 ENCOUNTER — Other Ambulatory Visit: Payer: Self-pay | Admitting: Gynecology

## 2018-09-24 ENCOUNTER — Encounter: Payer: Self-pay | Admitting: Gynecology

## 2018-09-24 DIAGNOSIS — E7800 Pure hypercholesterolemia, unspecified: Secondary | ICD-10-CM

## 2018-09-24 LAB — COMPREHENSIVE METABOLIC PANEL
AG Ratio: 1.5 (calc) (ref 1.0–2.5)
ALT: 13 U/L (ref 6–29)
AST: 23 U/L (ref 10–30)
Albumin: 4.5 g/dL (ref 3.6–5.1)
Alkaline phosphatase (APISO): 85 U/L (ref 33–115)
BILIRUBIN TOTAL: 0.6 mg/dL (ref 0.2–1.2)
BUN: 12 mg/dL (ref 7–25)
CALCIUM: 9.8 mg/dL (ref 8.6–10.2)
CO2: 20 mmol/L (ref 20–32)
Chloride: 106 mmol/L (ref 98–110)
Creat: 0.66 mg/dL (ref 0.50–1.10)
Globulin: 3 g/dL (calc) (ref 1.9–3.7)
Glucose, Bld: 92 mg/dL (ref 65–99)
Potassium: 4.6 mmol/L (ref 3.5–5.3)
SODIUM: 138 mmol/L (ref 135–146)
Total Protein: 7.5 g/dL (ref 6.1–8.1)

## 2018-09-24 LAB — LIPID PANEL
CHOL/HDL RATIO: 4.9 (calc) (ref <5.0)
CHOLESTEROL: 241 mg/dL — AB (ref <200)
HDL: 49 mg/dL — ABNORMAL LOW (ref >50)
LDL CHOLESTEROL (CALC): 165 mg/dL — AB
NON-HDL CHOLESTEROL (CALC): 192 mg/dL — AB (ref <130)
Triglycerides: 132 mg/dL (ref <150)

## 2018-09-24 LAB — CBC WITH DIFFERENTIAL/PLATELET
Absolute Monocytes: 540 cells/uL (ref 200–950)
Basophils Absolute: 60 cells/uL (ref 0–200)
Basophils Relative: 0.5 %
EOS PCT: 0.9 %
Eosinophils Absolute: 108 cells/uL (ref 15–500)
HEMATOCRIT: 41.3 % (ref 35.0–45.0)
HEMOGLOBIN: 13.8 g/dL (ref 11.7–15.5)
LYMPHS ABS: 3696 {cells}/uL (ref 850–3900)
MCH: 29 pg (ref 27.0–33.0)
MCHC: 33.4 g/dL (ref 32.0–36.0)
MCV: 86.8 fL (ref 80.0–100.0)
MONOS PCT: 4.5 %
MPV: 10.7 fL (ref 7.5–12.5)
Neutro Abs: 7596 cells/uL (ref 1500–7800)
Neutrophils Relative %: 63.3 %
Platelets: 362 10*3/uL (ref 140–400)
RBC: 4.76 10*6/uL (ref 3.80–5.10)
RDW: 13.6 % (ref 11.0–15.0)
Total Lymphocyte: 30.8 %
WBC: 12 10*3/uL — AB (ref 3.8–10.8)

## 2019-03-15 ENCOUNTER — Other Ambulatory Visit: Payer: Self-pay | Admitting: Gynecology

## 2019-03-15 DIAGNOSIS — Z1231 Encounter for screening mammogram for malignant neoplasm of breast: Secondary | ICD-10-CM

## 2019-03-16 ENCOUNTER — Ambulatory Visit (INDEPENDENT_AMBULATORY_CARE_PROVIDER_SITE_OTHER): Payer: BLUE CROSS/BLUE SHIELD | Admitting: Internal Medicine

## 2019-03-16 ENCOUNTER — Encounter: Payer: Self-pay | Admitting: Internal Medicine

## 2019-03-16 ENCOUNTER — Other Ambulatory Visit: Payer: Self-pay

## 2019-03-16 DIAGNOSIS — E785 Hyperlipidemia, unspecified: Secondary | ICD-10-CM | POA: Diagnosis not present

## 2019-03-16 DIAGNOSIS — E669 Obesity, unspecified: Secondary | ICD-10-CM

## 2019-03-16 NOTE — Progress Notes (Signed)
Virtual Visit via Video Note  I connected with Linda Winters on 03/16/19 at  9:00 AM EDT by a video enabled telemedicine application and verified that I am speaking with the correct person using two identifiers.  Location patient: home Location provider: work office Persons participating in the virtual visit: patient, provider  I discussed the limitations of evaluation and management by telemedicine and the availability of in person appointments. The patient expressed understanding and agreed to proceed.   HPI: This is a scheduled visit to establish care and discuss chronic conditions.  She has not had routine PCP care in years although she does see her GYN, Dr. Phineas Real on an annual basis.  Her past medical history significant for:  1.  Hysterectomy in 2017 due to endometriosis and heavy periods.  2.  Hyperlipidemia, not on medications.  3.  Obesity.  She does not take any prescription medications, will occasionally take Tylenol as needed for pain, she has no known drug allergies.  She used to be a smoker but quit about 10 years ago, at her maximum would smoke about half a pack a day.  She drinks alcohol occasionally maybe 2-3 drinks a week.  She has no acute complaints today.   ROS: Constitutional: Denies fever, chills, diaphoresis, appetite change and fatigue.  HEENT: Denies photophobia, eye pain, redness, hearing loss, ear pain, congestion, sore throat, rhinorrhea, sneezing, mouth sores, trouble swallowing, neck pain, neck stiffness and tinnitus.   Respiratory: Denies SOB, DOE, cough, chest tightness,  and wheezing.   Cardiovascular: Denies chest pain, palpitations and leg swelling.  Gastrointestinal: Denies nausea, vomiting, abdominal pain, diarrhea, constipation, blood in stool and abdominal distention.  Genitourinary: Denies dysuria, urgency, frequency, hematuria, flank pain and difficulty urinating.  Endocrine: Denies: hot or cold intolerance, sweats, changes in hair or  nails, polyuria, polydipsia. Musculoskeletal: Denies myalgias, back pain, joint swelling, arthralgias and gait problem.  Skin: Denies pallor, rash and wound.  Neurological: Denies dizziness, seizures, syncope, weakness, light-headedness, numbness and headaches.  Hematological: Denies adenopathy. Easy bruising, personal or family bleeding history  Psychiatric/Behavioral: Denies suicidal ideation, mood changes, confusion, nervousness, sleep disturbance and agitation   Past Medical History:  Diagnosis Date  . Anxiety   . Depression   . Endometriosis   . Hemoglobin C trait ([place])   . NSVD (normal spontaneous vaginal delivery)    X 2    Past Surgical History:  Procedure Laterality Date  . CYSTOSCOPY N/A 05/26/2017   Procedure: CYSTOSCOPY;  Surgeon: Anastasio Auerbach, MD;  Location: [facility];  Service: Gynecology;  Laterality: N/A;  . LAPAROSCOPIC VAGINAL HYSTERECTOMY WITH SALPINGECTOMY Bilateral 05/26/2017   Procedure: LAPAROSCOPIC ASSISTED VAGINAL HYSTERECTOMY WITH SALPINGECTOMY;  Surgeon: Anastasio Auerbach, MD;  Location: [facility];  Service: Gynecology;  Laterality: Bilateral;  requests 7:30am OR time  requests 2 1/2 hours  . PELVIC LAPAROSCOPY    . WISDOM TOOTH EXTRACTION      Family History  Problem Relation Age of Onset  . Diabetes Father   . Hypertension Father   . Heart disease Father   . Cancer Maternal Uncle        throat  . Cancer Maternal Grandfather        prostate  . Hypertension Mother   . Breast cancer Neg Hx     SOCIAL HX:   reports that she quit smoking about 8 years ago. Her smoking use included cigarettes. She smoked 0.25 packs per day. She has never used smokeless tobacco. She reports current  alcohol use. She reports that she does not use drugs.   Current Outpatient Medications:  .  ibuprofen (ADVIL,MOTRIN) 600 MG tablet, Take 600 mg by mouth 4 (four) times daily as needed for cramping., Disp: , Rfl:  .  Multiple Vitamin (MULTIVITAMIN WITH MINERALS) TABS  tablet, Take 1 tablet by mouth daily., Disp: , Rfl:   EXAM:   VITALS per patient if applicable: None reported  GENERAL: alert, oriented, appears well and in no acute distress  HEENT: atraumatic, conjunttiva clear, no obvious abnormalities on inspection of external nose and ears  NECK: normal movements of the head and neck  LUNGS: on inspection no signs of respiratory distress, breathing rate appears normal, no obvious gross increased work of breathing, gasping or wheezing  CV: no obvious cyanosis  MS: moves all visible extremities without noticeable abnormality  PSYCH/NEURO: pleasant and cooperative, no obvious depression or anxiety, speech and thought processing grossly intact  ASSESSMENT AND PLAN:   Hyperlipidemia, unspecified hyperlipidemia type -Last LDL was 165, not on medications. -We have discussed low-fat diet, increase physical activity with goal of weight loss.  Obesity (BMI 35.0-39.9 without comorbidity) -Discussed healthy lifestyle, including increased physical activity and better food choices to promote weight loss.      I discussed the assessment and treatment plan with the patient. The patient was provided an opportunity to ask questions and all were answered. The patient agreed with the plan and demonstrated an understanding of the instructions.   The patient was advised to call back or seek an in-person evaluation if the symptoms worsen or if the condition fails to improve as anticipated.    [name], MD  [facility]

## 2019-04-05 ENCOUNTER — Other Ambulatory Visit: Payer: Self-pay | Admitting: Internal Medicine

## 2019-04-05 ENCOUNTER — Other Ambulatory Visit: Payer: Self-pay

## 2019-04-05 ENCOUNTER — Ambulatory Visit (INDEPENDENT_AMBULATORY_CARE_PROVIDER_SITE_OTHER): Payer: BLUE CROSS/BLUE SHIELD | Admitting: Internal Medicine

## 2019-04-05 ENCOUNTER — Encounter: Payer: Self-pay | Admitting: Internal Medicine

## 2019-04-05 VITALS — BP 118/80 | HR 100 | Temp 98.6°F | Ht 68.5 in | Wt 227.8 lb

## 2019-04-05 DIAGNOSIS — E669 Obesity, unspecified: Secondary | ICD-10-CM | POA: Diagnosis not present

## 2019-04-05 DIAGNOSIS — E785 Hyperlipidemia, unspecified: Secondary | ICD-10-CM

## 2019-04-05 DIAGNOSIS — Z0000 Encounter for general adult medical examination without abnormal findings: Secondary | ICD-10-CM

## 2019-04-05 DIAGNOSIS — E538 Deficiency of other specified B group vitamins: Secondary | ICD-10-CM | POA: Insufficient documentation

## 2019-04-05 DIAGNOSIS — E66811 Obesity, class 1: Secondary | ICD-10-CM | POA: Insufficient documentation

## 2019-04-05 DIAGNOSIS — E559 Vitamin D deficiency, unspecified: Secondary | ICD-10-CM

## 2019-04-05 LAB — VITAMIN D 25 HYDROXY (VIT D DEFICIENCY, FRACTURES): VITD: 14.41 ng/mL — ABNORMAL LOW (ref 30.00–100.00)

## 2019-04-05 LAB — VITAMIN B12: Vitamin B-12: 142 pg/mL — ABNORMAL LOW (ref 211–911)

## 2019-04-05 LAB — TSH: TSH: 1.35 u[IU]/mL (ref 0.35–4.50)

## 2019-04-05 MED ORDER — VITAMIN D (ERGOCALCIFEROL) 1.25 MG (50000 UNIT) PO CAPS: 50000.0000 [IU] | ORAL_CAPSULE | ORAL | 0 refills | Status: DC

## 2019-04-05 NOTE — Progress Notes (Signed)
Established Patient Office Visit     CC/Reason for Visit: Annual preventive exam  HPI: Linda Winters is a 43 y.o. female who is coming in today for the above mentioned reasons. Past Medical History is significant for: Obesity and hyperlipidemia.  She has routine eye and dental care, she is up-to-date on age-appropriate immunizations.  She sees GYN for routine cervical cancer screening.  She had a mammogram in 2019.  She has no acute complaints today.   Past Medical/Surgical History: Past Medical History:  Diagnosis Date   Anxiety    Depression    Endometriosis    Hemoglobin C trait (HCC)    NSVD (normal spontaneous vaginal delivery)    X 2    Past Surgical History:  Procedure Laterality Date   CYSTOSCOPY N/A 05/26/2017   Procedure: CYSTOSCOPY;  Surgeon: Anastasio Auerbach, MD;  Location: [facility];  Service: Gynecology;  Laterality: N/A;   LAPAROSCOPIC VAGINAL HYSTERECTOMY WITH SALPINGECTOMY Bilateral 05/26/2017   Procedure: LAPAROSCOPIC ASSISTED VAGINAL HYSTERECTOMY WITH SALPINGECTOMY;  Surgeon: Anastasio Auerbach, MD;  Location: [facility];  Service: Gynecology;  Laterality: Bilateral;  requests 7:30am OR time  requests 2 1/2 hours   PELVIC LAPAROSCOPY     WISDOM TOOTH EXTRACTION      Social History:  reports that she quit smoking about 8 years ago. Her smoking use included cigarettes. She smoked 0.25 packs per day. She has never used smokeless tobacco. She reports current alcohol use. She reports that she does not use drugs.  Allergies: Allergies  Allergen Reactions   Compazine [Prochlorperazine Edisylate] Anxiety    Family History:  Family History  Problem Relation Age of Onset   Diabetes Father    Hypertension Father    Heart disease Father    Cancer Maternal Uncle        throat   Cancer Maternal Grandfather        prostate   Hypertension Mother    Breast cancer Neg Hx      Current Outpatient Medications:    ibuprofen (ADVIL,MOTRIN) 600  MG tablet, Take 600 mg by mouth 4 (four) times daily as needed for cramping., Disp: , Rfl:    Multiple Vitamin (MULTIVITAMIN WITH MINERALS) TABS tablet, Take 1 tablet by mouth daily., Disp: , Rfl:   Review of Systems:  Constitutional: Denies fever, chills, diaphoresis, appetite change and fatigue.  HEENT: Denies photophobia, eye pain, redness, hearing loss, ear pain, congestion, sore throat, rhinorrhea, sneezing, mouth sores, trouble swallowing, neck pain, neck stiffness and tinnitus.   Respiratory: Denies SOB, DOE, cough, chest tightness,  and wheezing.   Cardiovascular: Denies chest pain, palpitations and leg swelling.  Gastrointestinal: Denies nausea, vomiting, abdominal pain, diarrhea, constipation, blood in stool and abdominal distention.  Genitourinary: Denies dysuria, urgency, frequency, hematuria, flank pain and difficulty urinating.  Endocrine: Denies: hot or cold intolerance, sweats, changes in hair or nails, polyuria, polydipsia. Musculoskeletal: Denies myalgias, back pain, joint swelling, arthralgias and gait problem.  Skin: Denies pallor, rash and wound.  Neurological: Denies dizziness, seizures, syncope, weakness, light-headedness, numbness and headaches.  Hematological: Denies adenopathy. Easy bruising, personal or family bleeding history  Psychiatric/Behavioral: Denies suicidal ideation, mood changes, confusion, nervousness, sleep disturbance and agitation    Physical Exam: Vitals:   04/05/19 1342  BP: 118/80  Pulse: 100  Temp: 98.6 F (37 C)  TempSrc: Oral  SpO2: 96%  Weight: 227 lb 12.8 oz (103.3 kg)  Height: 5' 8.5" (1.74 m)    Body mass index is  34.13 kg/m.   Constitutional: NAD, calm, comfortable Eyes: PERRL, lids and conjunctivae normal ENMT: Mucous membranes are moist. Posterior pharynx clear of any exudate or lesions. Normal dentition. Tympanic membrane is pearly white, no erythema or bulging. Neck: normal, supple, no masses, no  thyromegaly Respiratory: clear to auscultation bilaterally, no wheezing, no crackles. Normal respiratory effort. No accessory muscle use.  Cardiovascular: Regular rate and rhythm, no murmurs / rubs / gallops. No extremity edema. 2+ pedal pulses. No carotid bruits.  Abdomen: no tenderness, no masses palpated. No hepatosplenomegaly. Bowel sounds positive.  Musculoskeletal: no clubbing / cyanosis. No joint deformity upper and lower extremities. Good ROM, no contractures. Normal muscle tone.  Skin: no rashes, lesions, ulcers. No induration Neurologic: CN 2-12 grossly intact. Sensation intact, DTR normal. Strength 5/5 in all 4.  Psychiatric: Normal judgment and insight. Alert and oriented x 3. Normal mood.    Impression and Plan:  Encounter for preventive health examination  -She is up-to-date on eye and dental care. -Immunizations are up-to-date and age-appropriate. -She is up-to-date on age-appropriate cancer screening, has Pap smears with GYN. -We have discussed healthy lifestyle in detail.  Hyperlipidemia, unspecified hyperlipidemia type -LDL was 165 however this was nonfasting. -Her GYN wants her to have this redone, however she is not fasting today.  She will return at a later date for this.  Obesity (BMI 30.0-34.9) -Discussed healthy lifestyle, including increased physical activity and better food choices to promote weight loss.    Patient Instructions  -Nice seeing you today!  -Lab work today; will notify you once results are available.  -Schedule follow up in 1 year or PRN.   Preventive Care 54-81 Years Old, Female Preventive care refers to visits with your health care provider and lifestyle choices that can promote health and wellness. This includes:  A yearly physical exam. This may also be called an annual well check.  Regular dental visits and eye exams.  Immunizations.  Screening for certain conditions.  Healthy lifestyle choices, such as eating a healthy diet,  getting regular exercise, not using drugs or products that contain nicotine and tobacco, and limiting alcohol use. What can I expect for my preventive care visit? Physical exam Your health care provider will check your:  Height and weight. This may be used to calculate body mass index (BMI), which tells if you are at a healthy weight.  Heart rate and blood pressure.  Skin for abnormal spots. Counseling Your health care provider may ask you questions about your:  Alcohol, tobacco, and drug use.  Emotional well-being.  Home and relationship well-being.  Sexual activity.  Eating habits.  Work and work Statistician.  Method of birth control.  Menstrual cycle.  Pregnancy history. What immunizations do I need?  Influenza (flu) vaccine  This is recommended every year. Tetanus, diphtheria, and pertussis (Tdap) vaccine  You may need a Td booster every 10 years. Varicella (chickenpox) vaccine  You may need this if you have not been vaccinated. Zoster (shingles) vaccine  You may need this after age 22. Measles, mumps, and rubella (MMR) vaccine  You may need at least one dose of MMR if you were born in 1957 or later. You may also need a second dose. Pneumococcal conjugate (PCV13) vaccine  You may need this if you have certain conditions and were not previously vaccinated. Pneumococcal polysaccharide (PPSV23) vaccine  You may need one or two doses if you smoke cigarettes or if you have certain conditions. Meningococcal conjugate (MenACWY) vaccine  You may need  this if you have certain conditions. Hepatitis A vaccine  You may need this if you have certain conditions or if you travel or work in places where you may be exposed to hepatitis A. Hepatitis B vaccine  You may need this if you have certain conditions or if you travel or work in places where you may be exposed to hepatitis B. Haemophilus influenzae type b (Hib) vaccine  You may need this if you have certain  conditions. Human papillomavirus (HPV) vaccine  If recommended by your health care provider, you may need three doses over 6 months. You may receive vaccines as individual doses or as more than one vaccine together in one shot (combination vaccines). Talk with your health care provider about the risks and benefits of combination vaccines. What tests do I need? Blood tests  Lipid and cholesterol levels. These may be checked every 5 years, or more frequently if you are over 78 years old.  Hepatitis C test.  Hepatitis B test. Screening  Lung cancer screening. You may have this screening every year starting at age 53 if you have a 30-pack-year history of smoking and currently smoke or have quit within the past 15 years.  Colorectal cancer screening. All adults should have this screening starting at age 66 and continuing until age 48. Your health care provider may recommend screening at age 50 if you are at increased risk. You will have tests every 1-10 years, depending on your results and the type of screening test.  Diabetes screening. This is done by checking your blood sugar (glucose) after you have not eaten for a while (fasting). You may have this done every 1-3 years.  Mammogram. This may be done every 1-2 years. Talk with your health care provider about when you should start having regular mammograms. This may depend on whether you have a family history of breast cancer.  BRCA-related cancer screening. This may be done if you have a family history of breast, ovarian, tubal, or peritoneal cancers.  Pelvic exam and Pap test. This may be done every 3 years starting at age 88. Starting at age 16, this may be done every 5 years if you have a Pap test in combination with an HPV test. Other tests  Sexually transmitted disease (STD) testing.  Bone density scan. This is done to screen for osteoporosis. You may have this scan if you are at high risk for osteoporosis. Follow these instructions  at home: Eating and drinking  Eat a diet that includes fresh fruits and vegetables, whole grains, lean protein, and low-fat dairy.  Take vitamin and mineral supplements as recommended by your health care provider.  Do not drink alcohol if: ? Your health care provider tells you not to drink. ? You are pregnant, may be pregnant, or are planning to become pregnant.  If you drink alcohol: ? Limit how much you have to 0-1 drink a day. ? Be aware of how much alcohol is in your drink. In the [place] one drink equals one 12 oz bottle of beer (355 mL), one 5 oz glass of wine (148 mL), or one 1 oz glass of hard liquor (44 mL). Lifestyle  Take daily care of your teeth and gums.  Stay active. Exercise for at least 30 minutes on 5 or more days each week.  Do not use any products that contain nicotine or tobacco, such as cigarettes, e-cigarettes, and chewing tobacco. If you need help quitting, ask your health care provider.  If you  are sexually active, practice safe sex. Use a condom or other form of birth control (contraception) in order to prevent pregnancy and STIs (sexually transmitted infections).  If told by your health care provider, take low-dose aspirin daily starting at age 68. What's next?  Visit your health care provider once a year for a well check visit.  Ask your health care provider how often you should have your eyes and teeth checked.  Stay up to date on all vaccines. This information is not intended to replace advice given to you by your health care provider. Make sure you discuss any questions you have with your health care provider. Document Released: 10/05/2015 Document Revised: 05/20/2018 Document Reviewed: 05/20/2018 Elsevier Patient Education  2020 [place], [place] [facility]

## 2019-04-05 NOTE — Patient Instructions (Signed)
-Nice seeing you today!  -Lab work today; will notify you once results are available.  -Schedule follow up in 1 year or PRN.   Preventive Care 34-42 Years Old, Female Preventive care refers to visits with your health care provider and lifestyle choices that can promote health and wellness. This includes:  A yearly physical exam. This may also be called an annual well check.  Regular dental visits and eye exams.  Immunizations.  Screening for certain conditions.  Healthy lifestyle choices, such as eating a healthy diet, getting regular exercise, not using drugs or products that contain nicotine and tobacco, and limiting alcohol use. What can I expect for my preventive care visit? Physical exam Your health care provider will check your:  Height and weight. This may be used to calculate body mass index (BMI), which tells if you are at a healthy weight.  Heart rate and blood pressure.  Skin for abnormal spots. Counseling Your health care provider may ask you questions about your:  Alcohol, tobacco, and drug use.  Emotional well-being.  Home and relationship well-being.  Sexual activity.  Eating habits.  Work and work Statistician.  Method of birth control.  Menstrual cycle.  Pregnancy history. What immunizations do I need?  Influenza (flu) vaccine  This is recommended every year. Tetanus, diphtheria, and pertussis (Tdap) vaccine  You may need a Td booster every 10 years. Varicella (chickenpox) vaccine  You may need this if you have not been vaccinated. Zoster (shingles) vaccine  You may need this after age 76. Measles, mumps, and rubella (MMR) vaccine  You may need at least one dose of MMR if you were born in 1957 or later. You may also need a second dose. Pneumococcal conjugate (PCV13) vaccine  You may need this if you have certain conditions and were not previously vaccinated. Pneumococcal polysaccharide (PPSV23) vaccine  You may need one or two  doses if you smoke cigarettes or if you have certain conditions. Meningococcal conjugate (MenACWY) vaccine  You may need this if you have certain conditions. Hepatitis A vaccine  You may need this if you have certain conditions or if you travel or work in places where you may be exposed to hepatitis A. Hepatitis B vaccine  You may need this if you have certain conditions or if you travel or work in places where you may be exposed to hepatitis B. Haemophilus influenzae type b (Hib) vaccine  You may need this if you have certain conditions. Human papillomavirus (HPV) vaccine  If recommended by your health care provider, you may need three doses over 6 months. You may receive vaccines as individual doses or as more than one vaccine together in one shot (combination vaccines). Talk with your health care provider about the risks and benefits of combination vaccines. What tests do I need? Blood tests  Lipid and cholesterol levels. These may be checked every 5 years, or more frequently if you are over 12 years old.  Hepatitis C test.  Hepatitis B test. Screening  Lung cancer screening. You may have this screening every year starting at age 32 if you have a 30-pack-year history of smoking and currently smoke or have quit within the past 15 years.  Colorectal cancer screening. All adults should have this screening starting at age 58 and continuing until age 46. Your health care provider may recommend screening at age 108 if you are at increased risk. You will have tests every 1-10 years, depending on your results and the type of screening  test.  Diabetes screening. This is done by checking your blood sugar (glucose) after you have not eaten for a while (fasting). You may have this done every 1-3 years.  Mammogram. This may be done every 1-2 years. Talk with your health care provider about when you should start having regular mammograms. This may depend on whether you have a family history of  breast cancer.  BRCA-related cancer screening. This may be done if you have a family history of breast, ovarian, tubal, or peritoneal cancers.  Pelvic exam and Pap test. This may be done every 3 years starting at age 85. Starting at age 41, this may be done every 5 years if you have a Pap test in combination with an HPV test. Other tests  Sexually transmitted disease (STD) testing.  Bone density scan. This is done to screen for osteoporosis. You may have this scan if you are at high risk for osteoporosis. Follow these instructions at home: Eating and drinking  Eat a diet that includes fresh fruits and vegetables, whole grains, lean protein, and low-fat dairy.  Take vitamin and mineral supplements as recommended by your health care provider.  Do not drink alcohol if: ? Your health care provider tells you not to drink. ? You are pregnant, may be pregnant, or are planning to become pregnant.  If you drink alcohol: ? Limit how much you have to 0-1 drink a day. ? Be aware of how much alcohol is in your drink. In the [place] one drink equals one 12 oz bottle of beer (355 mL), one 5 oz glass of wine (148 mL), or one 1 oz glass of hard liquor (44 mL). Lifestyle  Take daily care of your teeth and gums.  Stay active. Exercise for at least 30 minutes on 5 or more days each week.  Do not use any products that contain nicotine or tobacco, such as cigarettes, e-cigarettes, and chewing tobacco. If you need help quitting, ask your health care provider.  If you are sexually active, practice safe sex. Use a condom or other form of birth control (contraception) in order to prevent pregnancy and STIs (sexually transmitted infections).  If told by your health care provider, take low-dose aspirin daily starting at age 61. What's next?  Visit your health care provider once a year for a well check visit.  Ask your health care provider how often you should have your eyes and teeth checked.  Stay up to  date on all vaccines. This information is not intended to replace advice given to you by your health care provider. Make sure you discuss any questions you have with your health care provider. Document Released: 10/05/2015 Document Revised: 05/20/2018 Document Reviewed: 05/20/2018 Elsevier Patient Education  2020 [employer].

## 2019-04-06 ENCOUNTER — Other Ambulatory Visit: Payer: Self-pay | Admitting: Internal Medicine

## 2019-04-06 DIAGNOSIS — E559 Vitamin D deficiency, unspecified: Secondary | ICD-10-CM

## 2019-04-12 ENCOUNTER — Other Ambulatory Visit: Payer: Self-pay

## 2019-04-12 ENCOUNTER — Ambulatory Visit: Payer: BLUE CROSS/BLUE SHIELD | Admitting: *Deleted

## 2019-04-12 DIAGNOSIS — E538 Deficiency of other specified B group vitamins: Secondary | ICD-10-CM

## 2019-04-12 MED ORDER — CYANOCOBALAMIN 1000 MCG/ML IJ SOLN
1000.0000 ug | Freq: Once | INTRAMUSCULAR | Status: AC
  Administered 2020-01-09: 11:00:00 1000 ug via INTRAMUSCULAR

## 2019-04-12 NOTE — Progress Notes (Signed)
Per orders of Dr. Hernandez, injection of B12 given by Rachel Vereen. Patient tolerated injection well.  

## 2019-04-20 ENCOUNTER — Ambulatory Visit (INDEPENDENT_AMBULATORY_CARE_PROVIDER_SITE_OTHER): Payer: BLUE CROSS/BLUE SHIELD | Admitting: *Deleted

## 2019-04-20 ENCOUNTER — Other Ambulatory Visit: Payer: Self-pay

## 2019-04-20 DIAGNOSIS — E538 Deficiency of other specified B group vitamins: Secondary | ICD-10-CM

## 2019-04-20 MED ORDER — CYANOCOBALAMIN 1000 MCG/ML IJ SOLN
1000.0000 ug | Freq: Once | INTRAMUSCULAR | Status: AC
  Administered 2019-04-20: 10:00:00 1000 ug via INTRAMUSCULAR

## 2019-04-20 NOTE — Progress Notes (Signed)
Per orders of Dr. Hernandez, injection of B12 given by Rex Oesterle. Patient tolerated injection well.  

## 2019-04-27 ENCOUNTER — Ambulatory Visit (INDEPENDENT_AMBULATORY_CARE_PROVIDER_SITE_OTHER): Payer: BLUE CROSS/BLUE SHIELD | Admitting: *Deleted

## 2019-04-27 ENCOUNTER — Telehealth: Payer: Self-pay | Admitting: *Deleted

## 2019-04-27 ENCOUNTER — Other Ambulatory Visit: Payer: Self-pay

## 2019-04-27 DIAGNOSIS — E538 Deficiency of other specified B group vitamins: Secondary | ICD-10-CM | POA: Diagnosis not present

## 2019-04-27 MED ORDER — CYANOCOBALAMIN 1000 MCG/ML IJ SOLN
1000.0000 ug | Freq: Once | INTRAMUSCULAR | Status: AC
  Administered 2019-04-27: 1000 ug via INTRAMUSCULAR

## 2019-04-27 NOTE — Telephone Encounter (Signed)
Copied from [place] [phone]. Topic: General - Other >> Apr 27, 2019  4:01 PM Leward Quan A wrote: Reason for CRM: Patient would like a call back to schedule a nurse visit for her B-12 shot. Ph# [phone]

## 2019-04-27 NOTE — Progress Notes (Signed)
Per orders of Dr. Hernandez, injection of B12 given by Diannie Willner. Patient tolerated injection well.  

## 2019-04-28 ENCOUNTER — Ambulatory Visit: Payer: BLUE CROSS/BLUE SHIELD

## 2019-04-28 NOTE — Telephone Encounter (Signed)
B12 injection scheduled

## 2019-05-06 ENCOUNTER — Ambulatory Visit (INDEPENDENT_AMBULATORY_CARE_PROVIDER_SITE_OTHER): Payer: BLUE CROSS/BLUE SHIELD

## 2019-05-06 ENCOUNTER — Other Ambulatory Visit: Payer: Self-pay

## 2019-05-06 DIAGNOSIS — E538 Deficiency of other specified B group vitamins: Secondary | ICD-10-CM

## 2019-05-06 MED ORDER — CYANOCOBALAMIN 1000 MCG/ML IJ SOLN
1000.0000 ug | Freq: Once | INTRAMUSCULAR | Status: AC
  Administered 2019-05-06: 1000 ug via INTRAMUSCULAR

## 2019-05-06 NOTE — Progress Notes (Signed)
Pt tolerated shot well 

## 2019-05-10 ENCOUNTER — Telehealth: Payer: Self-pay | Admitting: *Deleted

## 2019-05-10 NOTE — Telephone Encounter (Signed)
Copied from [place] [id number]. Topic: General - Inquiry >> May 10, 2019  3:52 PM Richardo Priest, [place] wrote: Reason for CRM: Patient called to have lab order b-12. Last injection done Friday. Please advise and call back is [phone].

## 2019-05-11 ENCOUNTER — Other Ambulatory Visit: Payer: Self-pay | Admitting: Internal Medicine

## 2019-05-11 DIAGNOSIS — E538 Deficiency of other specified B group vitamins: Secondary | ICD-10-CM

## 2019-05-11 NOTE — Telephone Encounter (Signed)
Spoke with patient and lab order and appointment placed

## 2019-05-13 ENCOUNTER — Other Ambulatory Visit: Payer: Self-pay

## 2019-05-13 ENCOUNTER — Other Ambulatory Visit (INDEPENDENT_AMBULATORY_CARE_PROVIDER_SITE_OTHER): Payer: BLUE CROSS/BLUE SHIELD

## 2019-05-13 DIAGNOSIS — E538 Deficiency of other specified B group vitamins: Secondary | ICD-10-CM

## 2019-05-13 LAB — VITAMIN B12: Vitamin B-12: 455 pg/mL (ref 211–911)

## 2019-06-14 ENCOUNTER — Encounter: Payer: Self-pay | Admitting: Gynecology

## 2019-06-16 ENCOUNTER — Other Ambulatory Visit: Payer: Self-pay

## 2019-06-16 ENCOUNTER — Ambulatory Visit (INDEPENDENT_AMBULATORY_CARE_PROVIDER_SITE_OTHER): Payer: BLUE CROSS/BLUE SHIELD

## 2019-06-16 DIAGNOSIS — Z23 Encounter for immunization: Secondary | ICD-10-CM

## 2019-06-19 ENCOUNTER — Other Ambulatory Visit: Payer: Self-pay | Admitting: Internal Medicine

## 2019-06-19 DIAGNOSIS — E559 Vitamin D deficiency, unspecified: Secondary | ICD-10-CM

## 2019-07-07 ENCOUNTER — Other Ambulatory Visit: Payer: Self-pay | Admitting: Gynecology

## 2019-07-07 ENCOUNTER — Other Ambulatory Visit: Payer: Self-pay | Admitting: Internal Medicine

## 2019-07-07 DIAGNOSIS — Z1231 Encounter for screening mammogram for malignant neoplasm of breast: Secondary | ICD-10-CM

## 2019-07-11 ENCOUNTER — Other Ambulatory Visit: Payer: Self-pay

## 2019-07-12 ENCOUNTER — Other Ambulatory Visit: Payer: Self-pay | Admitting: Internal Medicine

## 2019-07-12 ENCOUNTER — Other Ambulatory Visit: Payer: Self-pay

## 2019-07-12 ENCOUNTER — Other Ambulatory Visit (INDEPENDENT_AMBULATORY_CARE_PROVIDER_SITE_OTHER): Payer: BLUE CROSS/BLUE SHIELD

## 2019-07-12 DIAGNOSIS — E559 Vitamin D deficiency, unspecified: Secondary | ICD-10-CM | POA: Diagnosis not present

## 2019-07-12 LAB — VITAMIN D 25 HYDROXY (VIT D DEFICIENCY, FRACTURES): VITD: 25.87 ng/mL — ABNORMAL LOW (ref 30.00–100.00)

## 2019-07-12 MED ORDER — VITAMIN D (ERGOCALCIFEROL) 1.25 MG (50000 UNIT) PO CAPS: 50000.0000 [IU] | ORAL_CAPSULE | ORAL | 0 refills | Status: AC

## 2019-07-14 ENCOUNTER — Other Ambulatory Visit: Payer: Self-pay | Admitting: Internal Medicine

## 2019-07-14 DIAGNOSIS — E559 Vitamin D deficiency, unspecified: Secondary | ICD-10-CM

## 2019-07-16 ENCOUNTER — Encounter: Payer: Self-pay | Admitting: Internal Medicine

## 2019-07-25 DIAGNOSIS — Z20828 Contact with and (suspected) exposure to other viral communicable diseases: Secondary | ICD-10-CM | POA: Diagnosis not present

## 2019-08-24 ENCOUNTER — Other Ambulatory Visit: Payer: Self-pay

## 2019-08-24 ENCOUNTER — Ambulatory Visit
Admission: RE | Admit: 2019-08-24 | Discharge: 2019-08-24 | Disposition: A | Discharge status: Home / Self Care | Payer: BLUE CROSS/BLUE SHIELD | Source: Ambulatory Visit | Attending: Internal Medicine | Admitting: Internal Medicine

## 2019-08-24 DIAGNOSIS — Z1231 Encounter for screening mammogram for malignant neoplasm of breast: Secondary | ICD-10-CM

## 2019-10-26 ENCOUNTER — Telehealth: Payer: Self-pay | Admitting: Internal Medicine

## 2019-10-26 NOTE — Telephone Encounter (Signed)
Pt is calling to have B-12 and Vitamin D labs done may I have orders so that we can schedule her.

## 2019-10-27 MED ORDER — CYANOCOBALAMIN 1000 MCG/ML IJ SOLN: INTRAMUSCULAR | 3 refills | Status: DC

## 2019-10-27 MED ORDER — "BD SAFETYGLIDE SYRINGE/NEEDLE 25G X 1"" 3 ML MISC": 11 refills | Status: DC

## 2019-10-27 NOTE — Telephone Encounter (Signed)
Spoke with patient. B12 lab not needed at this time.  Patient request Rx supplies.  - Rx sent. Vit d order already placed.  Lab appointment scheduled.

## 2019-11-02 ENCOUNTER — Other Ambulatory Visit: Payer: BLUE CROSS/BLUE SHIELD

## 2019-11-29 DIAGNOSIS — Z20822 Contact with and (suspected) exposure to covid-19: Secondary | ICD-10-CM | POA: Diagnosis not present

## 2019-12-03 DIAGNOSIS — Z20828 Contact with and (suspected) exposure to other viral communicable diseases: Secondary | ICD-10-CM | POA: Diagnosis not present

## 2019-12-21 ENCOUNTER — Ambulatory Visit: Payer: BLUE CROSS/BLUE SHIELD | Attending: Internal Medicine

## 2019-12-21 DIAGNOSIS — Z20822 Contact with and (suspected) exposure to covid-19: Secondary | ICD-10-CM

## 2019-12-22 ENCOUNTER — Other Ambulatory Visit: Payer: Self-pay | Admitting: Internal Medicine

## 2019-12-22 DIAGNOSIS — E559 Vitamin D deficiency, unspecified: Secondary | ICD-10-CM

## 2019-12-22 LAB — NOVEL CORONAVIRUS, NAA: SARS-CoV-2, NAA: NOT DETECTED

## 2020-01-05 ENCOUNTER — Encounter: Payer: Self-pay | Admitting: Internal Medicine

## 2020-01-05 ENCOUNTER — Ambulatory Visit: Payer: BLUE CROSS/BLUE SHIELD | Admitting: Internal Medicine

## 2020-01-05 ENCOUNTER — Other Ambulatory Visit: Payer: Self-pay

## 2020-01-05 VITALS — BP 110/80 | HR 99 | Temp 97.8°F | Wt 231.0 lb

## 2020-01-05 DIAGNOSIS — R202 Paresthesia of skin: Secondary | ICD-10-CM | POA: Diagnosis not present

## 2020-01-05 DIAGNOSIS — E785 Hyperlipidemia, unspecified: Secondary | ICD-10-CM

## 2020-01-05 DIAGNOSIS — E559 Vitamin D deficiency, unspecified: Secondary | ICD-10-CM

## 2020-01-05 DIAGNOSIS — E538 Deficiency of other specified B group vitamins: Secondary | ICD-10-CM | POA: Diagnosis not present

## 2020-01-05 DIAGNOSIS — M79602 Pain in left arm: Secondary | ICD-10-CM

## 2020-01-05 DIAGNOSIS — R200 Anesthesia of skin: Secondary | ICD-10-CM | POA: Diagnosis not present

## 2020-01-05 LAB — COMPREHENSIVE METABOLIC PANEL
ALT: 12 U/L (ref 0–35)
AST: 17 U/L (ref 0–37)
Albumin: 4.5 g/dL (ref 3.5–5.2)
Alkaline Phosphatase: 85 U/L (ref 39–117)
BUN: 12 mg/dL (ref 6–23)
CO2: 29 mEq/L (ref 19–32)
Calcium: 9.9 mg/dL (ref 8.4–10.5)
Chloride: 101 mEq/L (ref 96–112)
Creatinine, Ser: 0.54 mg/dL (ref 0.40–1.20)
GFR: 148.93 mL/min (ref >60.00)
Glucose, Bld: 71 mg/dL (ref 70–99)
Potassium: 4.3 mEq/L (ref 3.5–5.1)
Sodium: 137 mEq/L (ref 135–145)
Total Bilirubin: 0.5 mg/dL (ref 0.2–1.2)
Total Protein: 7.2 g/dL (ref 6.0–8.3)

## 2020-01-05 LAB — CBC WITH DIFFERENTIAL/PLATELET
Basophils Absolute: 0 10*3/uL (ref 0.0–0.1)
Basophils Relative: 0.3 % (ref 0.0–3.0)
Eosinophils Absolute: 0.1 10*3/uL (ref 0.0–0.7)
Eosinophils Relative: 0.9 % (ref 0.0–5.0)
HCT: 39 % (ref 36.0–46.0)
Hemoglobin: 13.2 g/dL (ref 12.0–15.0)
Lymphocytes Relative: 33.4 % (ref 12.0–46.0)
Lymphs Abs: 5.1 10*3/uL — ABNORMAL HIGH (ref 0.7–4.0)
MCHC: 33.9 g/dL (ref 30.0–36.0)
MCV: 86.5 fl (ref 78.0–100.0)
Monocytes Absolute: 1 10*3/uL (ref 0.1–1.0)
Monocytes Relative: 6.3 % (ref 3.0–12.0)
Neutro Abs: 9 10*3/uL — ABNORMAL HIGH (ref 1.4–7.7)
Neutrophils Relative %: 59.1 % (ref 43.0–77.0)
Platelets: 386 10*3/uL (ref 150.0–400.0)
RBC: 4.51 Mil/uL (ref 3.87–5.11)
RDW: 13.7 % (ref 11.5–15.5)
WBC: 15.2 10*3/uL — ABNORMAL HIGH (ref 4.0–10.5)

## 2020-01-05 LAB — LIPID PANEL
Cholesterol: 206 mg/dL — ABNORMAL HIGH (ref 0–200)
HDL: 41.9 mg/dL (ref >39.00)
LDL Cholesterol: 130 mg/dL — ABNORMAL HIGH (ref 0–99)
NonHDL: 163.94
Total CHOL/HDL Ratio: 5
Triglycerides: 169 mg/dL — ABNORMAL HIGH (ref 0.0–149.0)
VLDL: 33.8 mg/dL (ref 0.0–40.0)

## 2020-01-05 LAB — VITAMIN B12: Vitamin B-12: 308 pg/mL (ref 211–911)

## 2020-01-05 LAB — VITAMIN D 25 HYDROXY (VIT D DEFICIENCY, FRACTURES): VITD: 25.19 ng/mL — ABNORMAL LOW (ref 30.00–100.00)

## 2020-01-05 LAB — TSH: TSH: 1.43 u[IU]/mL (ref 0.35–4.50)

## 2020-01-05 MED ORDER — ASPIRIN EC 81 MG PO TBEC: 81.0000 mg | DELAYED_RELEASE_TABLET | Freq: Every day | ORAL | Status: DC

## 2020-01-05 NOTE — Patient Instructions (Addendum)
-  Nice seeing you today!!  -Lab work today; will notify you once results are available.  -Start aspirin 81 mg daily (enteric coated).  -Please go to ED if you have any further episodes to expedite your work up.  -Referral to cardiology and a CT scan of the head have been ordered today.

## 2020-01-05 NOTE — Progress Notes (Signed)
Acute office Visit     This visit occurred during the SARS-CoV-2 public health emergency.  Safety protocols were in place, including screening questions prior to the visit, additional usage of staff PPE, and extensive cleaning of exam room while observing appropriate contact time as indicated for disinfecting solutions.    CC/Reason for Visit: Left arm numbness and tingling  HPI: Linda Winters is a 44 y.o. female who is coming in today for the above mentioned reasons.  For about 1 month now she has been experiencing occasional episodes of left arm numbness and tingling.  These episodes will happen about once per week sometimes while walking and other times while sitting on the computer at work.  She feels like her arm gets stiff and has to move it and then she has spreading of numbness and tingling.  These episodes will last for hours at a time and do not seem to be relieved by anything.  She denies true chest pain, she denies shortness of breath.  She does feel dizzy during these episodes.  She denies palpitations.  She denies dyspepsia type symptoms.  She denies slurred speech, blurry vision.  She denies true arm weakness.  She does tell me that at times she gets short of breath while walking with her husband and this gets better after resting for a few minutes.  She thinks this is all due to being deconditioned.  Her coronary artery disease risk factors include: Obesity, hyperlipidemia (not on medications) and family history with her sister having had coronary artery disease and what sounds to be balloon angioplasty at age 93.  She is a never smoker.  She also has a history of vitamin B12 deficiency but stopped taking her monthly injections in the summer of last year.   Past Medical/Surgical History: Past Medical History:  Diagnosis Date  . Anxiety   . Depression   . Endometriosis   . Hemoglobin C trait ([place])   . NSVD (normal spontaneous vaginal delivery)    X 2    Past Surgical  History:  Procedure Laterality Date  . CYSTOSCOPY N/A 05/26/2017   Procedure: CYSTOSCOPY;  Surgeon: Anastasio Auerbach, MD;  Location: [facility];  Service: Gynecology;  Laterality: N/A;  . LAPAROSCOPIC VAGINAL HYSTERECTOMY WITH SALPINGECTOMY Bilateral 05/26/2017   Procedure: LAPAROSCOPIC ASSISTED VAGINAL HYSTERECTOMY WITH SALPINGECTOMY;  Surgeon: Anastasio Auerbach, MD;  Location: [facility];  Service: Gynecology;  Laterality: Bilateral;  requests 7:30am OR time  requests 2 1/2 hours  . PELVIC LAPAROSCOPY    . WISDOM TOOTH EXTRACTION      Social History:  reports that she quit smoking about 8 years ago. Her smoking use included cigarettes. She smoked 0.25 packs per day. She has never used smokeless tobacco. She reports current alcohol use. She reports that she does not use drugs.  Allergies: Allergies  Allergen Reactions  . Compazine [Prochlorperazine Edisylate] Anxiety    Family History:  Family History  Problem Relation Age of Onset  . Diabetes Father   . Hypertension Father   . Heart disease Father   . Cancer Maternal Uncle        throat  . Cancer Maternal Grandfather        prostate  . Hypertension Mother   . Breast cancer Neg Hx      Current Outpatient Medications:  .  ibuprofen (ADVIL,MOTRIN) 600 MG tablet, Take 600 mg by mouth 4 (four) times daily as needed for cramping., Disp: , Rfl:  .  Multiple Vitamin (MULTIVITAMIN WITH MINERALS) TABS tablet, Take 1 tablet by mouth daily., Disp: , Rfl:  .  Multiple Vitamins-Minerals (MULTIPLE VITAMINS/WOMENS PO), Take by mouth., Disp: , Rfl:  .  SYRINGE-NEEDLE, DISP, 3 ML (BD SAFETYGLIDE SYRINGE/NEEDLE) 25G X 1" 3 ML MISC, Use for B12 injections, Disp: 100 each, Rfl: 11 .  vitamin B-12 (CYANOCOBALAMIN) 500 MCG tablet, Take 500 mcg by mouth daily., Disp: , Rfl:  .  aspirin EC 81 MG tablet, Take 1 tablet (81 mg total) by mouth daily., Disp:  , Rfl:  .  cyanocobalamin (,VITAMIN B-12,) 1000 MCG/ML injection, Inject 16ml in deltoid once weekly  for 4 weeks, then inject 1 ml once a month thereafter (Patient not taking: Reported on 01/05/2020), Disp: 10 mL, Rfl: 3  Current Facility-Administered Medications:  .  cyanocobalamin ((VITAMIN B-12)) injection 1,000 mcg, 1,000 mcg, Intramuscular, Once, Isaac Bliss, Rayford Halsted, MD  Review of Systems:  Constitutional: Denies fever, chills, diaphoresis, appetite change and fatigue.  HEENT: Denies photophobia, eye pain, redness, hearing loss, ear pain, congestion, sore throat, rhinorrhea, sneezing, mouth sores, trouble swallowing, neck pain, neck stiffness and tinnitus.   Respiratory: Denies  cough, chest tightness,  and wheezing.   Cardiovascular: Denies palpitations and leg swelling.  Gastrointestinal: Denies nausea, vomiting, abdominal pain, diarrhea, constipation, blood in stool and abdominal distention.  Genitourinary: Denies dysuria, urgency, frequency, hematuria, flank pain and difficulty urinating.  Endocrine: Denies: hot or cold intolerance, sweats, changes in hair or nails, polyuria, polydipsia. Musculoskeletal: Denies myalgias, back pain, joint swelling, arthralgias and gait problem.  Skin: Denies pallor, rash and wound.  Neurological: Denies  seizures, syncope, weakness, light-headedness, headaches.  Hematological: Denies adenopathy. Easy bruising, personal or family bleeding history  Psychiatric/Behavioral: Denies suicidal ideation, mood changes, confusion, nervousness, sleep disturbance and agitation    Physical Exam: Vitals:   01/05/20 1307  BP: 110/80  Pulse: 99  Temp: 97.8 F (36.6 C)  TempSrc: Temporal  SpO2: 99%  Weight: 231 lb (104.8 kg)    Body mass index is 34.61 kg/m.   Constitutional: NAD, calm, comfortable Eyes: PERRL, lids and conjunctivae normal ENMT: Mucous membranes are moist.  Respiratory: clear to auscultation bilaterally, no wheezing, no crackles. Normal respiratory effort. No accessory muscle use.  Cardiovascular: Regular rate and rhythm, no  murmurs / rubs / gallops. No extremity edema.  Abdomen: no tenderness, no masses palpated. No hepatosplenomegaly. Bowel sounds positive.  Neurologic: Grossly intact and nonfocal Psychiatric: Normal judgment and insight. Alert and oriented x 3. Normal mood.    Impression and Plan:  B12 deficiency  - Plan: Vitamin B12  Left arm pain Numbness and tingling in left arm -Certainly concerning. -Differential diagnosis includes potential serious conditions such as: anginal equivalent, TIA, but also other conditions like cervical nerve impingement syndrome and also possibly her B12 deficiency. -She does have some coronary artery disease risk factors including obesity, hyperlipidemia and family history with her sister having been diagnosed with coronary artery disease in her 27s. -EKG done in office today and interpreted by myself shows sinus rhythm at a rate of 80, low voltage but no apparent acute ischemic changes. -Although I believe this is unlikely an anginal equivalent, given her presentation, her dyspnea on exertion and her risk factors I will refer her to cardiology for consideration of further testing. -Labs to be obtained today including vitamin B12 level, if deficient she will need to resume IM supplementation. -Because these episodes last hours at a time, I will order a CT scan of the head.   -  I will recommend that she start aspirin 81 mg daily. -She has been advised to go to the emergency department to expedite work-up if she has further episodes.   Vitamin D deficiency  - Plan: VITAMIN D 25 Hydroxy (Vit-D Deficiency, Fractures)  Hyperlipidemia, unspecified hyperlipidemia type  - Plan: Lipid panel    Patient Instructions  -Nice seeing you today!!  -Lab work today; will notify you once results are available.  -Start aspirin 81 mg daily (enteric coated).  -Please go to ED if you have any further episodes to expedite your work up.  -Referral to cardiology and a CT scan of the  head have been ordered today.      [name], MD [facility]

## 2020-01-05 NOTE — Addendum Note (Signed)
Addended by: Elmer Picker on: 01/05/2020 01:59 PM   Modules accepted: Orders

## 2020-01-06 ENCOUNTER — Other Ambulatory Visit: Payer: Self-pay | Admitting: Internal Medicine

## 2020-01-06 DIAGNOSIS — E559 Vitamin D deficiency, unspecified: Secondary | ICD-10-CM

## 2020-01-06 DIAGNOSIS — D72829 Elevated white blood cell count, unspecified: Secondary | ICD-10-CM

## 2020-01-06 MED ORDER — VITAMIN D (ERGOCALCIFEROL) 1.25 MG (50000 UNIT) PO CAPS: 50000.0000 [IU] | ORAL_CAPSULE | ORAL | 0 refills | Status: AC

## 2020-01-09 ENCOUNTER — Ambulatory Visit: Payer: BLUE CROSS/BLUE SHIELD | Admitting: Cardiovascular Disease

## 2020-01-09 ENCOUNTER — Ambulatory Visit (INDEPENDENT_AMBULATORY_CARE_PROVIDER_SITE_OTHER): Payer: BLUE CROSS/BLUE SHIELD | Admitting: *Deleted

## 2020-01-09 ENCOUNTER — Encounter: Payer: Self-pay | Admitting: Cardiovascular Disease

## 2020-01-09 ENCOUNTER — Encounter: Payer: Self-pay | Admitting: Internal Medicine

## 2020-01-09 ENCOUNTER — Other Ambulatory Visit: Payer: Self-pay

## 2020-01-09 DIAGNOSIS — E538 Deficiency of other specified B group vitamins: Secondary | ICD-10-CM | POA: Diagnosis not present

## 2020-01-09 DIAGNOSIS — R072 Precordial pain: Secondary | ICD-10-CM

## 2020-01-09 DIAGNOSIS — R202 Paresthesia of skin: Secondary | ICD-10-CM

## 2020-01-09 DIAGNOSIS — R0789 Other chest pain: Secondary | ICD-10-CM | POA: Diagnosis not present

## 2020-01-09 DIAGNOSIS — E785 Hyperlipidemia, unspecified: Secondary | ICD-10-CM

## 2020-01-09 DIAGNOSIS — E669 Obesity, unspecified: Secondary | ICD-10-CM | POA: Diagnosis not present

## 2020-01-09 DIAGNOSIS — E559 Vitamin D deficiency, unspecified: Secondary | ICD-10-CM

## 2020-01-09 MED ORDER — ATORVASTATIN CALCIUM 20 MG PO TABS: 20.0000 mg | ORAL_TABLET | Freq: Every day | ORAL | 3 refills | Status: DC

## 2020-01-09 NOTE — Progress Notes (Signed)
Cardiology Office Note    Date:  01/15/2020   ID:  [name], DOB 04/04/1976, MRN [id number]  PCP:  Isaac Bliss, Rayford Halsted, MD  Cardiologist:  Shelva Majestic, MD   Chief Complaint  Patient presents with  . New Patient (Initial Visit)    Left arm pain and tingling.  . Numbness   New cardiology evaluation referred through the courtesy of [name] for evaluation of left chest pain and arm numbness.  History of Present Illness:  CORRINA Winters is a 44 y.o. female who was recently evaluated by Dr. Isaac Bliss for left arm numbness and tingling with chest pain.  She is now referred for cardiology evaluation.  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  Over the past several weeks she admits to being under increased stress and has developed episodes of left-sided chest sensation with discomfort in the back of her shoulder and with arm numbness and tingling in her fingers.  Her symptoms are nonexertional precipitation.  Using do not occur while she walks.  She denies associated shortness of breath.  She describes the chest pain often as sharp and in her left shoulder.  When seen by Dr. Isaac Bliss lab work was obtained and she was empirically started on aspirin.  An ECG done in her office showed sinus rhythm at 80 bpm with low voltage but no acute ischemic changes.  There was concerned that cervical nerve impingement syndrome or possibly B12 deficiency, anginal equivalents could be considered in the differential diagnosis.  Laboratory revealed a normal B12 at 308.  Vitamin D level was low at 25.19.  TSH was normal at 1.43.  Lipid studies were elevated with total cholesterol 206, triglycerides 169, LDL 130 and HDL 41.  LDL had improved from 2 years ago when it was 156.  Chemistry profile is normal.  White count was elevated at 15.2 and she was not anemic with a hemoglobin of 13 and hematocrit of 39.0.  Cardiac risk factors include hyperlipidemia, family history for CAD  with a sister who may have undergone coronary intervention.  There is no tobacco history.  She presents for cardiology evaluation.   Past Medical History:  Diagnosis Date  . Anxiety   . Depression   . Endometriosis   . Hemoglobin C trait ([place])   . NSVD (normal spontaneous vaginal delivery)    X 2    Past Surgical History:  Procedure Laterality Date  . CYSTOSCOPY N/A 05/26/2017   Procedure: CYSTOSCOPY;  Surgeon: Anastasio Auerbach, MD;  Location: [facility];  Service: Gynecology;  Laterality: N/A;  . LAPAROSCOPIC VAGINAL HYSTERECTOMY WITH SALPINGECTOMY Bilateral 05/26/2017   Procedure: LAPAROSCOPIC ASSISTED VAGINAL HYSTERECTOMY WITH SALPINGECTOMY;  Surgeon: Anastasio Auerbach, MD;  Location: [facility];  Service: Gynecology;  Laterality: Bilateral;  requests 7:30am OR time  requests 2 1/2 hours  . PELVIC LAPAROSCOPY    . WISDOM TOOTH EXTRACTION      Current Medications: Outpatient Medications Prior to Visit  Medication Sig Dispense Refill  . aspirin EC 81 MG tablet Take 1 tablet (81 mg total) by mouth daily.    . cyanocobalamin (,VITAMIN B-12,) 1000 MCG/ML injection Inject 67m in deltoid once weekly for 4 weeks, then inject 1 ml once a month thereafter 10 mL 3  . ibuprofen (ADVIL,MOTRIN) 600 MG tablet Take 600 mg by mouth 4 (four) times daily as needed for cramping.    . Multiple Vitamin (MULTIVITAMIN WITH MINERALS) TABS tablet Take 1 tablet by mouth  daily.    . Multiple Vitamins-Minerals (MULTIPLE VITAMINS/WOMENS PO) Take by mouth.    . vitamin B-12 (CYANOCOBALAMIN) 500 MCG tablet Take 500 mcg by mouth daily.    . Vitamin D, Ergocalciferol, (DRISDOL) 1.25 MG (50000 UNIT) CAPS capsule Take 1 capsule (50,000 Units total) by mouth every 7 (seven) days for 12 doses. 12 capsule 0  . SYRINGE-NEEDLE, DISP, 3 ML (BD SAFETYGLIDE SYRINGE/NEEDLE) 25G X 1" 3 ML MISC Use for B12 injections 100 each 11   No facility-administered medications prior to visit.     Allergies:   Compazine [prochlorperazine  edisylate]   Social History   Socioeconomic History  . Marital status: Married    Spouse name: Not on file  . Number of children: Not on file  . Years of education: Not on file  . Highest education level: Not on file  Occupational History  . Not on file  Tobacco Use  . Smoking status: Former Smoker    Packs/day: 0.25    Types: Cigarettes    Quit date: 02/06/2011    Years since quitting: 8.9  . Smokeless tobacco: Never Used  Substance and Sexual Activity  . Alcohol use: Yes    Alcohol/week: 0.0 standard drinks    Comment: Social  . Drug use: No  . Sexual activity: Yes    Birth control/protection: Surgical    Comment: 1st intercourse 44 yo-More than 5 partners-[place]  Other Topics Concern  . Not on file  Social History Narrative  . Not on file   Social Determinants of Health   Financial Resource Strain:   . Difficulty of Paying Living Expenses:   Food Insecurity:   . Worried About Charity fundraiser in the Last Year:   . Arboriculturist in the Last Year:   Transportation Needs:   . Film/video editor (Medical):   Marland Kitchen Lack of Transportation (Non-Medical):   Physical Activity:   . Days of Exercise per Week:   . Minutes of Exercise per Session:   Stress:   . Feeling of Stress :   Social Connections:   . Frequency of Communication with Friends and Family:   . Frequency of Social Gatherings with Friends and Family:   . Attends Religious Services:   . Active Member of Clubs or Organizations:   . 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  She is married for 10 years.  She has 2 children and 72 year old with sickle cell disease and a 43 year old.  She completed 12th grade of education.  No tobacco history.  She walks approximately 1-2  times per week.  Family History:  The patient's family history includes Cancer in her maternal grandfather and maternal uncle; Diabetes in  her father; Heart disease in her father; Hypertension in her father and mother.  Her mother has interstitial lung disease atrial fibrillation her father died at age 71.  She has 3 sisters.  ROS General: Negative; No fevers, chills, or night sweats;  HEENT: Negative; No changes in vision or hearing, sinus congestion, difficulty swallowing Pulmonary: Negative; No cough, wheezing, shortness of breath, hemoptysis Cardiovascular: See HPI GI: Negative; No nausea, vomiting, diarrhea, or abdominal pain GU: Negative; No dysuria, hematuria, or difficulty voiding Musculoskeletal: Negative; no myalgias, joint pain, or weakness Hematologic/Oncology: Negative; no easy bruising, bleeding Endocrine: Negative; no heat/cold intolerance; no diabetes Neuro: Paresthesias down her left arm and fingers  Skin: Negative; No rashes or skin lesions Psychiatric: Negative; No behavioral problems, depression Sleep: positive for snoring, no daytime sleepiness, hypersomnolence, bruxism, restless legs, hypnogognic hallucinations, no cataplexy Other comprehensive 14 point system review is negative.   PHYSICAL EXAM:   VS:  BP 122/76 (BP Location: Left Arm, Patient Position: Sitting, Cuff Size: Large)   Pulse 87   Temp (!) 96.9 F (36.1 C)   Ht 5' 8.5" (1.74 m)   Wt 231 lb (104.8 kg)   LMP 05/08/2017 (Exact Date) Comment: GYN - [facility]  BMI 34.61 kg/m     Repeat blood pressure by me which is a 78  Wt Readings from Last 3 Encounters:  01/09/20 231 lb (104.8 kg)  01/05/20 231 lb (104.8 kg)  04/05/19 227 lb 12.8 oz (103.3 kg)    General: Alert, oriented, no distress.  Skin: normal turgor, no rashes, warm and dry HEENT: Normocephalic, atraumatic. Pupils equal round and reactive to light; sclera anicteric; extraocular muscles intact;  Nose without nasal septal hypertrophy Mouth/Parynx benign; Mallinpatti scale 3 Neck: Thick neck; no JVD, no carotid bruits; normal carotid upstroke Lungs: clear to  ausculatation and percussion; no wheezing or rales Chest wall: No tenderness over the costochondral region Heart: PMI not displaced, RRR, s1 s2 normal, 1/6 systolic murmur, no diastolic murmur, no rubs, gallops, thrills, or heaves Abdomen: soft, nontender; no hepatosplenomehaly, BS+; abdominal aorta nontender and not dilated by palpation. Back: no CVA tenderness Pulses 2+ Musculoskeletal: full range of motion, normal strength, no joint deformities Extremities: no clubbing cyanosis or edema, Homan's sign negative  Neurologic: grossly nonfocal; Cranial nerves grossly wnl Psychologic: Normal mood and affect   Studies/Labs Reviewed:   EKG:  EKG is ordered today. ECG (independently read by me): NSR at 87; IRBBB with repolarization changes  Recent Labs: BMP Latest Ref Rng & Units 01/05/2020 09/23/2018 03/13/2017  Glucose 70 - 99 mg/dL 71 92 97  BUN 6 - 23 mg/dL _0 Creatinine 0.40 - 1.20 mg/dL 0.54 0.66 0.70  BUN/Creat Ratio 6 - 22 (calc) - NOT APPLICABLE -  Sodium 122 - 145 mEq/L 137 138 138  Potassium 3.5 - 5.1 mEq/L 4.3 4.6 4.3  Chloride 96 - 112 mEq/L 101 106 102  CO2 19 - 32 mEq/L _1 Calcium 8.4 - 10.5 mg/dL 9.9 9.8 9.7     Hepatic Function Latest Ref Rng & Units 01/05/2020 09/23/2018 03/13/2017  Total Protein 6.0 - 8.3 g/dL 7.2 7.5 7.2  Albumin 3.5 - 5.2 g/dL 4.5 - 4.5  AST 0 - 37 U/L _2 ALT 0 - 35 U/L _3 Alk Phosphatase 39 - 117 U/L 85 - 79  Total Bilirubin 0.2 - 1.2 mg/dL 0.5 0.6 0.6    CBC Latest Ref Rng & Units 01/05/2020 09/23/2018 05/14/2017  WBC 4.0 - 10.5 K/uL 15.2(H) 12.0(H) 13.0(H)  Hemoglobin 12.0 - 15.0 g/dL 13.2 13.8 13.6  Hematocrit 36.0 - 46.0 % 39.0 41.3 38.8  Platelets 150.0 - 400.0 K/uL 386.0 362 370   Lab Results  Component Value Date   MCV 86.5 01/05/2020   MCV 86.8 09/23/2018   MCV 84.5 05/14/2017   Lab Results  Component Value Date   TSH 1.43 01/05/2020   Lab Results  Component Value Date   HGBA1C 5.8 (H) 06/08/2012      BNP No results found for: BNP  ProBNP No results found for: PROBNP   Lipid Panel     Component Value Date/Time  CHOL 206 (H) 01/05/2020 1359   TRIG 169.0 (H) 01/05/2020 1359   HDL 41.90 01/05/2020 1359   CHOLHDL 5 01/05/2020 1359   VLDL 33.8 01/05/2020 1359   LDLCALC 130 (H) 01/05/2020 1359   LDLCALC 165 (H) 09/23/2018 0951     RADIOLOGY: No results found.   Additional studies/ records that were reviewed today include:  I reviewed the records of Dr. Jerilee Hoh approximately including all evaluations as well as laboratory data   ASSESSMENT:    1. Atypical chest pain   2. Paresthesias   3. Hyperlipidemia, unspecified hyperlipidemia type   4. Obesity (BMI 30.0-34.9)   5. Vitamin D insufficiency      PLAN:  Ms. Lenzi Marmo is a 44 year old female who admits to a 2-week history of atypical left-sided chest discomfort with paresthesias down her left arm.  Her symptoms are not exertionally precipitated.  She specifically denies any exercise-induced chest pressure or shortness of breath.  At times she does note sharp sensation in the region of her shoulder.  Her blood pressure today is stable.  Her symptoms do not sound ischemic in etiology and most likely are musculoskeletal.  She has been referred for a head CT  by Dr. Deniece Ree to assess for possible cervical disease.  I discussed with her at length typical symptoms of angina pectoris.  Her ECG is stable and shows normal sinus rhythm with mild RV conduction delay.  Laboratories notable for history of hyperlipidemia.  Her LDL's have consistently been elevated in 2 years ago LDL was 156 but most recent value at 130.  I discussed data with her regarding subclinical atherosclerosis.  I have recommended initiation of atorvastatin 20 mg daily rather than 10 mg which had initially been recommended by Dr. Christel Mormon in which she has not yet started.  Her vitamin D level is low and she is currently undergoing supplementation.  She has a  history of vitamin B deficiency and her current level is low normal with plans to resume monthly injections.  I have recommended she undergo a cardiac calcium score particularly with her hyperlipidemia as an initial screening test for coronary atherosclerosis.   Medication Adjustments/Labs and Tests Ordered: Current medicines are reviewed at length with the patient today.  Concerns regarding medicines are outlined above.  Medication changes, Labs and Tests ordered today are listed in the Patient Instructions below. Patient Instructions  Medication Instructions:  BEGIN TAKING ATORVASTATIN 20MG DAILY  *If you need a refill on your cardiac medications before your next appointment, please call your pharmacy*   Testing/Procedures: Your physician has requested that you have an echocardiogram. Echocardiography is a painless test that uses sound waves to create images of your heart. It provides your doctor with information about the size and shape of your heart and how well your heart's chambers and valves are working. This procedure takes approximately one hour. There are no restrictions for this procedure.  [place]   Dr.Damarko Stitely has ordered a CT coronary calcium score. This test is done at [address]. This is $150 out of pocket.   Coronary CalciumScan A coronary calcium scan is an imaging test used to look for deposits of calcium and other fatty materials (plaques) in the inner lining of the blood vessels of the heart (coronary arteries). These deposits of calcium and plaques can partly clog and narrow the coronary arteries without producing any symptoms or warning signs. This puts a person at risk for a heart attack. This test  can detect these deposits before symptoms develop. Tell a health care provider about:  Any allergies you have.  All medicines you are taking, including vitamins, herbs, eye drops, creams, and over-the-counter medicines.  Any problems you or  family members have had with anesthetic medicines.  Any blood disorders you have.  Any surgeries you have had.  Any medical conditions you have.  Whether you are pregnant or may be pregnant. What are the risks? Generally, this is a safe procedure. However, problems may occur, including:  Harm to a pregnant woman and her unborn baby. This test involves the use of radiation. Radiation exposure can be dangerous to a pregnant woman and her unborn baby. If you are pregnant, you generally should not have this procedure done.  Slight increase in the risk of cancer. This is because of the radiation involved in the test. What happens before the procedure? No preparation is needed for this procedure. What happens during the procedure?  You will undress and remove any jewelry around your neck or chest.  You will put on a hospital gown.  Sticky electrodes will be placed on your chest. The electrodes will be connected to an electrocardiogram (ECG) machine to record a tracing of the electrical activity of your heart.  A CT scanner will take pictures of your heart. During this time, you will be asked to lie still and hold your breath for 2-3 seconds while a picture of your heart is being taken. The procedure may vary among health care providers and hospitals. What happens after the procedure?  You can get dressed.  You can return to your normal activities.  It is up to you to get the results of your test. Ask your health care provider, or the department that is doing the test, when your results will be ready. Summary  A coronary calcium scan is an imaging test used to look for deposits of calcium and other fatty materials (plaques) in the inner lining of the blood vessels of the heart (coronary arteries).  Generally, this is a safe procedure. Tell your health care provider if you are pregnant or may be pregnant.  No preparation is needed for this procedure.  A CT scanner will take pictures  of your heart.  You can return to your normal activities after the scan is done. This information is not intended to replace advice given to you by your health care provider. Make sure you discuss any questions you have with your health care provider. Document Released: 03/06/2008 Document Revised: 07/28/2016 Document Reviewed: 07/28/2016 Elsevier Interactive Patient Education  2017 [place]: At [facility], you and your health needs are our priority.  As part of our continuing mission to provide you with exceptional heart care, we have created designated Provider Care Teams.  These Care Teams include your primary Cardiologist (physician) and Advanced Practice Providers (APPs -  Physician Assistants and Nurse Practitioners) who all work together to provide you with the care you need, when you need it.  We recommend signing up for the patient portal called "MyChart".  Sign up information is provided on this After Visit Summary.  MyChart is used to connect with patients for Virtual Visits (Telemedicine).  Patients are able to view lab/test results, encounter notes, upcoming appointments, etc.  Non-urgent messages can be sent to your provider as well.   To learn more about what you can do with MyChart, go to [email or web address].    Your next appointment:  2 month(s)  The format for your next appointment:   In Person  Provider:   Shelva Majestic, MD       Signed, Shelva Majestic, MD  01/15/2020 12:30 PM    [facility] [address], [place], [place], [place]  [address] Phone: [phone]

## 2020-01-09 NOTE — Patient Instructions (Signed)
Medication Instructions:  BEGIN TAKING ATORVASTATIN 20MG  DAILY  *If you need a refill on your cardiac medications before your next appointment, please call your pharmacy*   Testing/Procedures: Your physician has requested that you have an echocardiogram. Echocardiography is a painless test that uses sound waves to create images of your heart. It provides your doctor with information about the size and shape of your heart and how well your heart's chambers and valves are working. This procedure takes approximately one hour. There are no restrictions for this procedure.  [place]   Dr.Kelly has ordered a CT coronary calcium score. This test is done at [address]. This is $150 out of pocket.   Coronary CalciumScan A coronary calcium scan is an imaging test used to look for deposits of calcium and other fatty materials (plaques) in the inner lining of the blood vessels of the heart (coronary arteries). These deposits of calcium and plaques can partly clog and narrow the coronary arteries without producing any symptoms or warning signs. This puts a person at risk for a heart attack. This test can detect these deposits before symptoms develop. Tell a health care provider about:  Any allergies you have.  All medicines you are taking, including vitamins, herbs, eye drops, creams, and over-the-counter medicines.  Any problems you or family members have had with anesthetic medicines.  Any blood disorders you have.  Any surgeries you have had.  Any medical conditions you have.  Whether you are pregnant or may be pregnant. What are the risks? Generally, this is a safe procedure. However, problems may occur, including:  Harm to a pregnant woman and her unborn baby. This test involves the use of radiation. Radiation exposure can be dangerous to a pregnant woman and her unborn baby. If you are pregnant, you generally should not have this procedure done.  Slight  increase in the risk of cancer. This is because of the radiation involved in the test. What happens before the procedure? No preparation is needed for this procedure. What happens during the procedure?  You will undress and remove any jewelry around your neck or chest.  You will put on a hospital gown.  Sticky electrodes will be placed on your chest. The electrodes will be connected to an electrocardiogram (ECG) machine to record a tracing of the electrical activity of your heart.  A CT scanner will take pictures of your heart. During this time, you will be asked to lie still and hold your breath for 2-3 seconds while a picture of your heart is being taken. The procedure may vary among health care providers and hospitals. What happens after the procedure?  You can get dressed.  You can return to your normal activities.  It is up to you to get the results of your test. Ask your health care provider, or the department that is doing the test, when your results will be ready. Summary  A coronary calcium scan is an imaging test used to look for deposits of calcium and other fatty materials (plaques) in the inner lining of the blood vessels of the heart (coronary arteries).  Generally, this is a safe procedure. Tell your health care provider if you are pregnant or may be pregnant.  No preparation is needed for this procedure.  A CT scanner will take pictures of your heart.  You can return to your normal activities after the scan is done. This information is not intended to replace advice given to  you by your health care provider. Make sure you discuss any questions you have with your health care provider. Document Released: 03/06/2008 Document Revised: 07/28/2016 Document Reviewed: 07/28/2016 Elsevier Interactive Patient Education  2017 [place]: At [facility], you and your health needs are our priority.  As part of our continuing mission to provide you with  exceptional heart care, we have created designated Provider Care Teams.  These Care Teams include your primary Cardiologist (physician) and Advanced Practice Providers (APPs -  Physician Assistants and Nurse Practitioners) who all work together to provide you with the care you need, when you need it.  We recommend signing up for the patient portal called "MyChart".  Sign up information is provided on this After Visit Summary.  MyChart is used to connect with patients for Virtual Visits (Telemedicine).  Patients are able to view lab/test results, encounter notes, upcoming appointments, etc.  Non-urgent messages can be sent to your provider as well.   To learn more about what you can do with MyChart, go to [email or web address].    Your next appointment:   2 month(s)  The format for your next appointment:   In Person  Provider:   Shelva Majestic, MD

## 2020-01-09 NOTE — Progress Notes (Signed)
Patient in office for B-12 injection. Injection administered in left deltoid with no reactions in office.

## 2020-01-15 ENCOUNTER — Encounter: Payer: Self-pay | Admitting: Cardiovascular Disease

## 2020-01-19 ENCOUNTER — Ambulatory Visit (INDEPENDENT_AMBULATORY_CARE_PROVIDER_SITE_OTHER): Payer: BLUE CROSS/BLUE SHIELD

## 2020-01-19 ENCOUNTER — Other Ambulatory Visit: Payer: Self-pay

## 2020-01-19 DIAGNOSIS — E538 Deficiency of other specified B group vitamins: Secondary | ICD-10-CM

## 2020-01-19 MED ORDER — CYANOCOBALAMIN 1000 MCG/ML IJ SOLN
1000.0000 ug | Freq: Once | INTRAMUSCULAR | Status: AC
  Administered 2020-01-19: 11:00:00 1000 ug via INTRAMUSCULAR

## 2020-01-19 NOTE — Progress Notes (Signed)
Per orders of Dr. Hernandez , injection of B12 given in R deltoid by Kallin Henk R Carlester Kasparek. Patient tolerated injection well. 

## 2020-01-19 NOTE — Patient Instructions (Signed)
Health Maintenance Due  Topic Date Due  . HIV Screening  Never done  . COVID-19 Vaccine (1) Never done    Depression screen [facility] 2/9 04/05/2019  Decreased Interest 0  Down, Depressed, Hopeless 0  PHQ - 2 Score 0  Altered sleeping 0  Tired, decreased energy 0  Change in appetite 0  Feeling bad or failure about yourself  0  Trouble concentrating 0  Moving slowly or fidgety/restless 0  Suicidal thoughts 0  PHQ-9 Score 0  Difficult doing work/chores Not difficult at all

## 2020-01-25 ENCOUNTER — Other Ambulatory Visit: Payer: Self-pay

## 2020-01-26 ENCOUNTER — Ambulatory Visit (INDEPENDENT_AMBULATORY_CARE_PROVIDER_SITE_OTHER): Payer: BLUE CROSS/BLUE SHIELD

## 2020-01-26 DIAGNOSIS — E538 Deficiency of other specified B group vitamins: Secondary | ICD-10-CM

## 2020-01-26 MED ORDER — CYANOCOBALAMIN 1000 MCG/ML IJ SOLN
1000.0000 ug | Freq: Once | INTRAMUSCULAR | Status: AC
  Administered 2020-01-26: 1000 ug via INTRAMUSCULAR

## 2020-01-26 NOTE — Patient Instructions (Signed)
Health Maintenance Due  Topic Date Due  . HIV Screening  Never done  . COVID-19 Vaccine (1) Never done    Depression screen [facility] 2/9 04/05/2019  Decreased Interest 0  Down, Depressed, Hopeless 0  PHQ - 2 Score 0  Altered sleeping 0  Tired, decreased energy 0  Change in appetite 0  Feeling bad or failure about yourself  0  Trouble concentrating 0  Moving slowly or fidgety/restless 0  Suicidal thoughts 0  PHQ-9 Score 0  Difficult doing work/chores Not difficult at all

## 2020-01-26 NOTE — Progress Notes (Signed)
Per orders of Dr.Hernandez , injection of B12 given in Left  deltoid by Enes Rokosz R Elisa Sorlie. Patient tolerated injection well.  

## 2020-01-27 ENCOUNTER — Ambulatory Visit (HOSPITAL_COMMUNITY): Payer: BLUE CROSS/BLUE SHIELD | Attending: Cardiology

## 2020-01-27 ENCOUNTER — Ambulatory Visit (INDEPENDENT_AMBULATORY_CARE_PROVIDER_SITE_OTHER)
Admission: RE | Admit: 2020-01-27 | Discharge: 2020-01-27 | Disposition: A | Discharge status: Home / Self Care | Payer: BLUE CROSS/BLUE SHIELD | Source: Ambulatory Visit | Attending: Cardiovascular Disease | Admitting: Cardiovascular Disease

## 2020-01-27 ENCOUNTER — Other Ambulatory Visit: Payer: Self-pay

## 2020-01-27 DIAGNOSIS — R0789 Other chest pain: Secondary | ICD-10-CM | POA: Insufficient documentation

## 2020-02-02 ENCOUNTER — Ambulatory Visit: Payer: BLUE CROSS/BLUE SHIELD

## 2020-02-06 ENCOUNTER — Other Ambulatory Visit: Payer: Self-pay

## 2020-04-02 ENCOUNTER — Ambulatory Visit: Payer: BLUE CROSS/BLUE SHIELD | Admitting: Cardiovascular Disease

## 2020-04-02 ENCOUNTER — Encounter: Payer: Self-pay | Admitting: Cardiovascular Disease

## 2020-04-02 ENCOUNTER — Other Ambulatory Visit: Payer: Self-pay

## 2020-04-02 ENCOUNTER — Other Ambulatory Visit (INDEPENDENT_AMBULATORY_CARE_PROVIDER_SITE_OTHER): Payer: BLUE CROSS/BLUE SHIELD

## 2020-04-02 DIAGNOSIS — E559 Vitamin D deficiency, unspecified: Secondary | ICD-10-CM | POA: Diagnosis not present

## 2020-04-02 NOTE — Addendum Note (Signed)
Addended by: Marrion Coy on: 04/02/2020 10:11 AM   Modules accepted: Orders

## 2020-04-03 ENCOUNTER — Other Ambulatory Visit: Payer: Self-pay | Admitting: Internal Medicine

## 2020-04-03 DIAGNOSIS — E559 Vitamin D deficiency, unspecified: Secondary | ICD-10-CM

## 2020-04-03 LAB — VITAMIN D 25 HYDROXY (VIT D DEFICIENCY, FRACTURES): Vit D, 25-Hydroxy: 39 ng/mL (ref 30–100)

## 2020-04-03 MED ORDER — VITAMIN D (ERGOCALCIFEROL) 1.25 MG (50000 UNIT) PO CAPS: 50000.0000 [IU] | ORAL_CAPSULE | ORAL | 0 refills | Status: AC

## 2020-04-13 ENCOUNTER — Ambulatory Visit: Payer: BLUE CROSS/BLUE SHIELD | Admitting: Internal Medicine

## 2020-04-13 ENCOUNTER — Other Ambulatory Visit: Payer: Self-pay

## 2020-04-13 ENCOUNTER — Encounter: Payer: Self-pay | Admitting: Internal Medicine

## 2020-04-13 VITALS — BP 110/78 | HR 80 | Temp 98.0°F | Wt 215.5 lb

## 2020-04-13 DIAGNOSIS — E785 Hyperlipidemia, unspecified: Secondary | ICD-10-CM | POA: Diagnosis not present

## 2020-04-13 DIAGNOSIS — R739 Hyperglycemia, unspecified: Secondary | ICD-10-CM

## 2020-04-13 DIAGNOSIS — E669 Obesity, unspecified: Secondary | ICD-10-CM | POA: Diagnosis not present

## 2020-04-13 DIAGNOSIS — R5383 Other fatigue: Secondary | ICD-10-CM | POA: Diagnosis not present

## 2020-04-13 DIAGNOSIS — E559 Vitamin D deficiency, unspecified: Secondary | ICD-10-CM

## 2020-04-13 DIAGNOSIS — E538 Deficiency of other specified B group vitamins: Secondary | ICD-10-CM

## 2020-04-13 LAB — POCT GLYCOSYLATED HEMOGLOBIN (HGB A1C): Hemoglobin A1C: 5.3 % (ref 4.0–5.6)

## 2020-04-13 MED ORDER — "BD SAFETYGLIDE SYRINGE/NEEDLE 25G X 1"" 3 ML MISC": 11 refills | Status: DC

## 2020-04-13 MED ORDER — CYANOCOBALAMIN 1000 MCG/ML IJ SOLN: INTRAMUSCULAR | 3 refills | Status: DC

## 2020-04-13 NOTE — Progress Notes (Signed)
Established Patient Office Visit     This visit occurred during the SARS-CoV-2 public health emergency.  Safety protocols were in place, including screening questions prior to the visit, additional usage of staff PPE, and extensive cleaning of exam room while observing appropriate contact time as indicated for disinfecting solutions.    CC/Reason for Visit: Discuss fatigue  HPI: Linda Winters is a 44 y.o. female who is coming in today for the above mentioned reasons. During her last visit I had referred her to cardiology due to some CAD risk factors with mild dyspnea on exertion and fatigue. She saw cardiology who put her on Lipitor 20 mg daily and sent her for coronary CT scan. Her coronary CT scan had a calcium score of zero. She decided not to take the Lipitor as instead she is trying lifestyle modifications. She has started a low-carb lifestyle in the last 3 weeks and has had some increased fatigue and low energy. She wonders if she might be having high blood sugars or diabetes.   Past Medical/Surgical History: Past Medical History:  Diagnosis Date  . Anxiety   . Depression   . Endometriosis   . Hemoglobin C trait ([place])   . NSVD (normal spontaneous vaginal delivery)    X 2    Past Surgical History:  Procedure Laterality Date  . CYSTOSCOPY N/A 05/26/2017   Procedure: CYSTOSCOPY;  Surgeon: Anastasio Auerbach, MD;  Location: [facility];  Service: Gynecology;  Laterality: N/A;  . LAPAROSCOPIC VAGINAL HYSTERECTOMY WITH SALPINGECTOMY Bilateral 05/26/2017   Procedure: LAPAROSCOPIC ASSISTED VAGINAL HYSTERECTOMY WITH SALPINGECTOMY;  Surgeon: Anastasio Auerbach, MD;  Location: [facility];  Service: Gynecology;  Laterality: Bilateral;  requests 7:30am OR time  requests 2 1/2 hours  . PELVIC LAPAROSCOPY    . WISDOM TOOTH EXTRACTION      Social History:  reports that she quit smoking about 9 years ago. Her smoking use included cigarettes. She smoked 0.25 packs per day. She has never used  smokeless tobacco. She reports current alcohol use. She reports that she does not use drugs.  Allergies: Allergies  Allergen Reactions  . Compazine [Prochlorperazine Edisylate] Anxiety    Family History:  Family History  Problem Relation Age of Onset  . Diabetes Father   . Hypertension Father   . Heart disease Father   . Cancer Maternal Uncle        throat  . Cancer Maternal Grandfather        prostate  . Hypertension Mother   . Breast cancer Neg Hx      Current Outpatient Medications:  .  aspirin EC 81 MG tablet, Take 1 tablet (81 mg total) by mouth daily., Disp:  , Rfl:  .  cyanocobalamin (,VITAMIN B-12,) 1000 MCG/ML injection, Inject 76ml in deltoid once weekly for 4 weeks, then inject 1 ml once a month thereafter, Disp: 10 mL, Rfl: 3 .  ibuprofen (ADVIL,MOTRIN) 600 MG tablet, Take 600 mg by mouth 4 (four) times daily as needed for cramping., Disp: , Rfl:  .  Multiple Vitamin (MULTIVITAMIN WITH MINERALS) TABS tablet, Take 1 tablet by mouth daily., Disp: , Rfl:  .  Multiple Vitamins-Minerals (MULTIPLE VITAMINS/WOMENS PO), Take by mouth., Disp: , Rfl:  .  vitamin B-12 (CYANOCOBALAMIN) 500 MCG tablet, Take 500 mcg by mouth daily., Disp: , Rfl:  .  Vitamin D, Ergocalciferol, (DRISDOL) 1.25 MG (50000 UNIT) CAPS capsule, Take 1 capsule (50,000 Units total) by mouth every 7 (seven) days for 12 doses.,  Disp: 12 capsule, Rfl: 0 .  atorvastatin (LIPITOR) 20 MG tablet, Take 1 tablet (20 mg total) by mouth daily., Disp: 90 tablet, Rfl: 3  Review of Systems:  Constitutional: Denies fever, chills, diaphoresis, appetite change. HEENT: Denies photophobia, eye pain, redness, hearing loss, ear pain, congestion, sore throat, rhinorrhea, sneezing, mouth sores, trouble swallowing, neck pain, neck stiffness and tinnitus.   Respiratory: Denies SOB, DOE, cough, chest tightness,  and wheezing.   Cardiovascular: Denies chest pain, palpitations and leg swelling.  Gastrointestinal: Denies nausea,  vomiting, abdominal pain, diarrhea, constipation, blood in stool and abdominal distention.  Genitourinary: Denies dysuria, urgency, frequency, hematuria, flank pain and difficulty urinating.  Endocrine: Denies: hot or cold intolerance, sweats, changes in hair or nails, polyuria, polydipsia. Musculoskeletal: Denies myalgias, back pain, joint swelling, arthralgias and gait problem.  Skin: Denies pallor, rash and wound.  Neurological: Denies dizziness, seizures, syncope, weakness, light-headedness, numbness and headaches.  Hematological: Denies adenopathy. Easy bruising, personal or family bleeding history  Psychiatric/Behavioral: Denies suicidal ideation, mood changes, confusion, nervousness, sleep disturbance and agitation    Physical Exam: Vitals:   04/13/20 0714  BP: 110/78  Pulse: 80  Temp: 98 F (36.7 C)  TempSrc: Oral  SpO2: 99%  Weight: (!) 215 lb 8 oz (97.8 kg)    Body mass index is 31.82 kg/m.   Constitutional: NAD, calm, comfortable Eyes: PERRL, lids and conjunctivae normal ENMT: Mucous membranes are moist.  Respiratory: clear to auscultation bilaterally, no wheezing, no crackles. Normal respiratory effort. No accessory muscle use.  Cardiovascular: Regular rate and rhythm, no murmurs / rubs / gallops. No extremity edema. .  Neurologic: Grossly intact and nonfocal Psychiatric: Normal judgment and insight. Alert and oriented x 3. Normal mood.    Impression and Plan:  Fatigue, unspecified type  Hyperglycemia - Plan: POCT glycosylated hemoglobin (Hb A1C)  Obesity (BMI 30.0-34.9)  Hyperlipidemia, unspecified hyperlipidemia type  Vitamin D deficiency  Vitamin B12 deficiency  -Calcium score of zero on coronary CT scan is highly reassuring. -I am okay with her not starting Lipitor and attempting lifestyle modifications first. -I think her increased fatigue since starting a low-carb diet is probably due to ketogenesis and the "keto flu". -She has been able to lose  around 15 pounds, she has been congratulated on her success. -I will follow up with her in the next 4 months.    Linda Frohlich, MD [facility] at [facility]

## 2020-04-30 DIAGNOSIS — Z20822 Contact with and (suspected) exposure to covid-19: Secondary | ICD-10-CM | POA: Diagnosis not present

## 2020-05-22 ENCOUNTER — Telehealth: Payer: BLUE CROSS/BLUE SHIELD | Admitting: Cardiovascular Disease

## 2020-06-25 ENCOUNTER — Other Ambulatory Visit: Payer: Self-pay | Admitting: Internal Medicine

## 2020-06-25 DIAGNOSIS — E559 Vitamin D deficiency, unspecified: Secondary | ICD-10-CM

## 2020-06-28 ENCOUNTER — Other Ambulatory Visit: Payer: Self-pay | Admitting: Internal Medicine

## 2020-06-28 DIAGNOSIS — E559 Vitamin D deficiency, unspecified: Secondary | ICD-10-CM

## 2020-07-06 ENCOUNTER — Other Ambulatory Visit: Payer: BLUE CROSS/BLUE SHIELD

## 2020-07-06 ENCOUNTER — Other Ambulatory Visit: Payer: Self-pay

## 2020-07-06 DIAGNOSIS — E559 Vitamin D deficiency, unspecified: Secondary | ICD-10-CM | POA: Diagnosis not present

## 2020-07-07 LAB — VITAMIN D 25 HYDROXY (VIT D DEFICIENCY, FRACTURES): Vit D, 25-Hydroxy: 76 ng/mL (ref 30–100)

## 2020-07-10 ENCOUNTER — Other Ambulatory Visit: Payer: Self-pay | Admitting: Internal Medicine

## 2020-07-10 DIAGNOSIS — Z1231 Encounter for screening mammogram for malignant neoplasm of breast: Secondary | ICD-10-CM

## 2020-08-07 ENCOUNTER — Ambulatory Visit: Payer: BLUE CROSS/BLUE SHIELD

## 2020-09-03 ENCOUNTER — Ambulatory Visit
Admission: RE | Admit: 2020-09-03 | Discharge: 2020-09-03 | Disposition: A | Discharge status: Home / Self Care | Payer: BLUE CROSS/BLUE SHIELD | Source: Ambulatory Visit | Attending: Internal Medicine | Admitting: Internal Medicine

## 2020-09-03 ENCOUNTER — Other Ambulatory Visit: Payer: Self-pay

## 2020-09-03 DIAGNOSIS — Z1231 Encounter for screening mammogram for malignant neoplasm of breast: Secondary | ICD-10-CM | POA: Diagnosis not present

## 2020-10-04 DIAGNOSIS — Z1152 Encounter for screening for COVID-19: Secondary | ICD-10-CM | POA: Diagnosis not present

## 2020-10-09 ENCOUNTER — Encounter: Payer: BLUE CROSS/BLUE SHIELD | Admitting: Nurse Practitioner

## 2020-11-08 ENCOUNTER — Encounter: Payer: Self-pay | Admitting: *Deleted

## 2020-11-09 ENCOUNTER — Encounter: Payer: BLUE CROSS/BLUE SHIELD | Admitting: Internal Medicine

## 2020-11-22 ENCOUNTER — Encounter: Payer: Self-pay | Admitting: Nurse Practitioner

## 2020-11-22 ENCOUNTER — Ambulatory Visit: Payer: BLUE CROSS/BLUE SHIELD | Admitting: Nurse Practitioner

## 2020-11-22 ENCOUNTER — Other Ambulatory Visit: Payer: Self-pay

## 2020-11-22 VITALS — BP 124/80 | Ht 69.0 in | Wt 205.0 lb

## 2020-11-22 DIAGNOSIS — Z01419 Encounter for gynecological examination (general) (routine) without abnormal findings: Secondary | ICD-10-CM

## 2020-11-22 DIAGNOSIS — Z90710 Acquired absence of both cervix and uterus: Secondary | ICD-10-CM | POA: Diagnosis not present

## 2020-11-22 NOTE — Patient Instructions (Signed)

## 2020-11-22 NOTE — Progress Notes (Signed)
   Linda Winters July 07, 1976 [id number]   History:  45 y.o. [id number] presents for annual exam without GYN complaints. 2018 LAVH with bilateral salpingectomies for endometriosis - no HRT. Normal pap and mammogram history.   Gynecologic History Patient's last menstrual period was 05/08/2017 (exact date).   Contraception/Family planning: status post hysterectomy  Health Maintenance Last Pap: 2017. Results were: normal Last mammogram: 09/06/2020. Results were: normal Last colonoscopy: N/A Last Dexa: N/A  Past medical history, past surgical history, family history and social history were all reviewed and documented in the EPIC chart.  ROS:  A ROS was performed and pertinent positives and negatives are included.  Exam:  Vitals:   11/22/20 0842  BP: 124/80  Weight: 205 lb (93 kg)  Height: 5\' 9"  (1.753 m)   Body mass index is 30.27 kg/m.  General appearance:  Normal Thyroid:  Symmetrical, normal in size, without palpable masses or nodularity. Respiratory  Auscultation:  Clear without wheezing or rhonchi Cardiovascular  Auscultation:  Regular rate, without rubs, murmurs or gallops  Edema/varicosities:  Not grossly evident Abdominal  Soft,nontender, without masses, guarding or rebound.  Liver/spleen:  No organomegaly noted  Hernia:  None appreciated  Skin  Inspection:  Grossly normal   Breasts: Examined lying and sitting.   Right: Without masses, retractions, discharge or axillary adenopathy.   Left: Without masses, retractions, discharge or axillary adenopathy. Gentitourinary   Inguinal/mons:  Normal without inguinal adenopathy  External genitalia:  Normal  BUS/Urethra/Skene's glands:  Normal  Vagina:  Normal  Cervix:  Absent  Uterus:  Absent  Adnexa/parametria:     Rt: Without masses or tenderness.   Lt: Without masses or tenderness.  Anus and perineum: Normal  Digital rectal exam: Normal sphincter tone without palpated masses or tenderness  Assessment/Plan:  45  y.o. [id number] for annual exam.   Well female exam with routine gynecological exam - Education provided on SBEs, importance of preventative screenings, current guidelines, high calcium diet, regular exercise, and multivitamin daily. Labs with PCP.   History of laparoscopic-assisted vaginal hysterectomy - 2018 for endometriosis  Screening for cervical cancer - Normal Pap history.  No longer screening per guidelines.  Screening for breast cancer - Normal mammogram history.  Continue annual screenings.  Normal breast exam today.  Follow up in 1 year for annual.     Tamela Gammon [facility], 8:53 AM 11/22/2020

## 2020-12-18 ENCOUNTER — Encounter: Payer: BLUE CROSS/BLUE SHIELD | Admitting: Internal Medicine

## 2021-01-18 ENCOUNTER — Encounter: Payer: Self-pay | Admitting: Internal Medicine

## 2021-01-22 ENCOUNTER — Other Ambulatory Visit: Payer: Self-pay | Admitting: Internal Medicine

## 2021-01-22 DIAGNOSIS — E538 Deficiency of other specified B group vitamins: Secondary | ICD-10-CM

## 2021-01-22 DIAGNOSIS — R5383 Other fatigue: Secondary | ICD-10-CM

## 2021-01-23 ENCOUNTER — Other Ambulatory Visit (INDEPENDENT_AMBULATORY_CARE_PROVIDER_SITE_OTHER): Payer: BLUE CROSS/BLUE SHIELD

## 2021-01-23 ENCOUNTER — Other Ambulatory Visit: Payer: Self-pay

## 2021-01-23 DIAGNOSIS — E538 Deficiency of other specified B group vitamins: Secondary | ICD-10-CM | POA: Diagnosis not present

## 2021-01-23 DIAGNOSIS — R5383 Other fatigue: Secondary | ICD-10-CM

## 2021-01-23 LAB — VITAMIN D 25 HYDROXY (VIT D DEFICIENCY, FRACTURES): VITD: 26.79 ng/mL — ABNORMAL LOW (ref 30.00–100.00)

## 2021-01-23 LAB — MAGNESIUM: Magnesium: 2 mg/dL (ref 1.5–2.5)

## 2021-01-24 ENCOUNTER — Other Ambulatory Visit: Payer: Self-pay | Admitting: Internal Medicine

## 2021-01-24 DIAGNOSIS — E559 Vitamin D deficiency, unspecified: Secondary | ICD-10-CM

## 2021-01-24 MED ORDER — VITAMIN D (ERGOCALCIFEROL) 1.25 MG (50000 UNIT) PO CAPS: 50000.0000 [IU] | ORAL_CAPSULE | ORAL | 0 refills | Status: DC

## 2021-02-20 ENCOUNTER — Other Ambulatory Visit: Payer: Self-pay | Admitting: Internal Medicine

## 2021-02-20 DIAGNOSIS — E559 Vitamin D deficiency, unspecified: Secondary | ICD-10-CM

## 2021-02-28 ENCOUNTER — Other Ambulatory Visit: Payer: Self-pay

## 2021-03-01 ENCOUNTER — Other Ambulatory Visit: Payer: Self-pay | Admitting: Internal Medicine

## 2021-03-01 ENCOUNTER — Encounter: Payer: Self-pay | Admitting: Internal Medicine

## 2021-03-01 ENCOUNTER — Ambulatory Visit (INDEPENDENT_AMBULATORY_CARE_PROVIDER_SITE_OTHER): Payer: BLUE CROSS/BLUE SHIELD | Admitting: Internal Medicine

## 2021-03-01 VITALS — BP 110/80 | HR 108 | Temp 98.0°F | Ht 68.0 in | Wt 212.9 lb

## 2021-03-01 DIAGNOSIS — E559 Vitamin D deficiency, unspecified: Secondary | ICD-10-CM

## 2021-03-01 DIAGNOSIS — E669 Obesity, unspecified: Secondary | ICD-10-CM

## 2021-03-01 DIAGNOSIS — E785 Hyperlipidemia, unspecified: Secondary | ICD-10-CM

## 2021-03-01 DIAGNOSIS — Z0000 Encounter for general adult medical examination without abnormal findings: Secondary | ICD-10-CM

## 2021-03-01 DIAGNOSIS — E538 Deficiency of other specified B group vitamins: Secondary | ICD-10-CM

## 2021-03-01 DIAGNOSIS — E66811 Obesity, class 1: Secondary | ICD-10-CM

## 2021-03-01 DIAGNOSIS — E782 Mixed hyperlipidemia: Secondary | ICD-10-CM

## 2021-03-01 LAB — COMPREHENSIVE METABOLIC PANEL
ALT: 8 U/L (ref 0–35)
AST: 14 U/L (ref 0–37)
Albumin: 4.5 g/dL (ref 3.5–5.2)
Alkaline Phosphatase: 56 U/L (ref 39–117)
BUN: 12 mg/dL (ref 6–23)
CO2: 27 mEq/L (ref 19–32)
Calcium: 9.4 mg/dL (ref 8.4–10.5)
Chloride: 104 mEq/L (ref 96–112)
Creatinine, Ser: 0.59 mg/dL (ref 0.40–1.20)
GFR: 109.64 mL/min (ref >60.00)
Glucose, Bld: 96 mg/dL (ref 70–99)
Potassium: 4.3 mEq/L (ref 3.5–5.1)
Sodium: 139 mEq/L (ref 135–145)
Total Bilirubin: 0.7 mg/dL (ref 0.2–1.2)
Total Protein: 7.4 g/dL (ref 6.0–8.3)

## 2021-03-01 LAB — CBC WITH DIFFERENTIAL/PLATELET
Basophils Absolute: 0 10*3/uL (ref 0.0–0.1)
Basophils Relative: 0.3 % (ref 0.0–3.0)
Eosinophils Absolute: 0.1 10*3/uL (ref 0.0–0.7)
Eosinophils Relative: 1 % (ref 0.0–5.0)
HCT: 39.9 % (ref 36.0–46.0)
Hemoglobin: 13.9 g/dL (ref 12.0–15.0)
Lymphocytes Relative: 26.4 % (ref 12.0–46.0)
Lymphs Abs: 3.6 10*3/uL (ref 0.7–4.0)
MCHC: 34.9 g/dL (ref 30.0–36.0)
MCV: 86.9 fl (ref 78.0–100.0)
Monocytes Absolute: 0.7 10*3/uL (ref 0.1–1.0)
Monocytes Relative: 5.1 % (ref 3.0–12.0)
Neutro Abs: 9.2 10*3/uL — ABNORMAL HIGH (ref 1.4–7.7)
Neutrophils Relative %: 67.2 % (ref 43.0–77.0)
Platelets: 335 10*3/uL (ref 150.0–400.0)
RBC: 4.6 Mil/uL (ref 3.87–5.11)
RDW: 13.1 % (ref 11.5–15.5)
WBC: 13.7 10*3/uL — ABNORMAL HIGH (ref 4.0–10.5)

## 2021-03-01 LAB — LIPID PANEL
Cholesterol: 203 mg/dL — ABNORMAL HIGH (ref 0–200)
HDL: 42.9 mg/dL (ref >39.00)
LDL Cholesterol: 142 mg/dL — ABNORMAL HIGH (ref 0–99)
NonHDL: 160.12
Total CHOL/HDL Ratio: 5
Triglycerides: 90 mg/dL (ref 0.0–149.0)
VLDL: 18 mg/dL (ref 0.0–40.0)

## 2021-03-01 LAB — VITAMIN D 25 HYDROXY (VIT D DEFICIENCY, FRACTURES): VITD: 26.54 ng/mL — ABNORMAL LOW (ref 30.00–100.00)

## 2021-03-01 LAB — TSH: TSH: 1.32 u[IU]/mL (ref 0.35–4.50)

## 2021-03-01 LAB — VITAMIN B12: Vitamin B-12: >1550 pg/mL — ABNORMAL HIGH (ref 211–911)

## 2021-03-01 LAB — HEMOGLOBIN A1C: Hgb A1c MFr Bld: 5.5 % (ref 4.6–6.5)

## 2021-03-01 MED ORDER — CYANOCOBALAMIN 1000 MCG/ML IJ SOLN: 1000.0000 ug | INTRAMUSCULAR | 1 refills | Status: DC

## 2021-03-01 MED ORDER — "BD ECLIPSE NEEDLE 25G X 5/8"" MISC": 1 refills | Status: DC

## 2021-03-01 MED ORDER — ATORVASTATIN CALCIUM 20 MG PO TABS: 20.0000 mg | ORAL_TABLET | Freq: Every day | ORAL | 3 refills | Status: DC

## 2021-03-01 MED ORDER — CYANOCOBALAMIN 1000 MCG/ML IJ SOLN
1000.0000 ug | Freq: Once | INTRAMUSCULAR | Status: AC
  Administered 2021-03-01: 1000 ug via INTRAMUSCULAR

## 2021-03-01 NOTE — Addendum Note (Signed)
Addended by: Westley Hummer B on: 03/01/2021 09:29 AM   Modules accepted: Orders

## 2021-03-01 NOTE — Addendum Note (Signed)
Addended by: Amanda Cockayne on: 03/01/2021 09:09 AM   Modules accepted: Orders

## 2021-03-01 NOTE — Patient Instructions (Signed)
-  Nice seeing you today!!  -Lab work today; will notify you once results are available.  -Schedule follow up in 6 months. 

## 2021-03-01 NOTE — Progress Notes (Signed)
Established Patient Office Visit     This visit occurred during the SARS-CoV-2 public health emergency.  Safety protocols were in place, including screening questions prior to the visit, additional usage of staff PPE, and extensive cleaning of exam room while observing appropriate contact time as indicated for disinfecting solutions.    CC/Reason for Visit: Annual preventive exam  HPI: Linda Winters is a 45 y.o. female who is coming in today for the above mentioned reasons. Past Medical History is significant for: Vitamin B12 and vitamin D deficiency, hyperlipidemia who never started taking atorvastatin and mild obesity.  She was recently seen by cardiology to rule out coronary artery disease given some symptoms of dyspnea on exertion and left arm pain.  This work-up culminated in a coronary CT scan with a calcium score of 0.  Her symptoms have improved.  She has had 3 COVID vaccines, she has routine eye and dental care.  She has not been exercising.  She had a mammogram in December 2021, she follows with GYN.  She is interested in resuming monthly B12 injections as she feels oral supplementation does not work as well.   Past Medical/Surgical History: Past Medical History:  Diagnosis Date   Anxiety    Depression    Endometriosis    Hemoglobin C trait ([facility])    NSVD (normal spontaneous vaginal delivery)    X 2    Past Surgical History:  Procedure Laterality Date   CYSTOSCOPY N/A 05/26/2017   Procedure: CYSTOSCOPY;  Surgeon: Anastasio Auerbach, MD;  Location: [facility];  Service: Gynecology;  Laterality: N/A;   LAPAROSCOPIC VAGINAL HYSTERECTOMY WITH SALPINGECTOMY Bilateral 05/26/2017   Procedure: LAPAROSCOPIC ASSISTED VAGINAL HYSTERECTOMY WITH SALPINGECTOMY;  Surgeon: Anastasio Auerbach, MD;  Location: [facility];  Service: Gynecology;  Laterality: Bilateral;  requests 7:30am OR time  requests 2 1/2 hours   PELVIC LAPAROSCOPY     WISDOM TOOTH EXTRACTION      Social History:  reports  that she quit smoking about 10 years ago. Her smoking use included cigarettes. She smoked an average of 0.25 packs per day. She has never used smokeless tobacco. She reports current alcohol use. She reports that she does not use drugs.  Allergies: Allergies  Allergen Reactions   Compazine [Prochlorperazine Edisylate] Anxiety    Family History:  Family History  Problem Relation Age of Onset   Diabetes Father    Hypertension Father    Heart disease Father    Cancer Maternal Uncle        throat   Cancer Maternal Grandfather        prostate   Hypertension Mother    Breast cancer Neg Hx      Current Outpatient Medications:    ibuprofen (ADVIL,MOTRIN) 600 MG tablet, Take 600 mg by mouth 4 (four) times daily as needed for cramping., Disp: , Rfl:    Multiple Vitamin (MULTIVITAMIN WITH MINERALS) TABS tablet, Take 1 tablet by mouth daily., Disp: , Rfl:    vitamin B-12 (CYANOCOBALAMIN) 500 MCG tablet, Take 500 mcg by mouth daily., Disp: , Rfl:    Vitamin D, Ergocalciferol, (DRISDOL) 1.25 MG (50000 UNIT) CAPS capsule, TAKE 1 CAPSULE (50,000 UNITS TOTAL) BY MOUTH EVERY 7 (SEVEN) DAYS FOR 12 DOSES., Disp: 4 capsule, Rfl: 2   atorvastatin (LIPITOR) 20 MG tablet, Take 1 tablet (20 mg total) by mouth daily. (Patient not taking: Reported on 11/22/2020), Disp: 90 tablet, Rfl: 3  Review of Systems:  Constitutional: Denies fever, chills, diaphoresis,  appetite change and fatigue.  HEENT: Denies photophobia, eye pain, redness, hearing loss, ear pain, congestion, sore throat, rhinorrhea, sneezing, mouth sores, trouble swallowing, neck pain, neck stiffness and tinnitus.   Respiratory: Denies SOB, DOE, cough, chest tightness,  and wheezing.   Cardiovascular: Denies chest pain, palpitations and leg swelling.  Gastrointestinal: Denies nausea, vomiting, abdominal pain, diarrhea, constipation, blood in stool and abdominal distention.  Genitourinary: Denies dysuria, urgency, frequency, hematuria, flank pain and  difficulty urinating.  Endocrine: Denies: hot or cold intolerance, sweats, changes in hair or nails, polyuria, polydipsia. Musculoskeletal: Denies myalgias, back pain, joint swelling, arthralgias and gait problem.  Skin: Denies pallor, rash and wound.  Neurological: Denies dizziness, seizures, syncope, weakness, light-headedness, numbness and headaches.  Hematological: Denies adenopathy. Easy bruising, personal or family bleeding history  Psychiatric/Behavioral: Denies suicidal ideation, mood changes, confusion, nervousness, sleep disturbance and agitation    Physical Exam: Vitals:   03/01/21 0831  BP: 110/80  Pulse: (!) 108  Temp: 98 F (36.7 C)  TempSrc: Oral  SpO2: 96%  Weight: 212 lb 14.4 oz (96.6 kg)  Height: 5\' 8"  (1.727 m)    Body mass index is 32.37 kg/m.   Constitutional: NAD, calm, comfortable Eyes: PERRL, lids and conjunctivae normal ENMT: Mucous membranes are moist. Posterior pharynx clear of any exudate or lesions. Normal dentition. Tympanic membrane is pearly white, no erythema or bulging. Neck: normal, supple, no masses, no thyromegaly Respiratory: clear to auscultation bilaterally, no wheezing, no crackles. Normal respiratory effort. No accessory muscle use.  Cardiovascular: Regular rate and rhythm, no murmurs / rubs / gallops. No extremity edema. 2+ pedal pulses. No carotid bruits.  Abdomen: no tenderness, no masses palpated. No hepatosplenomegaly. Bowel sounds positive.  Musculoskeletal: no clubbing / cyanosis. No joint deformity upper and lower extremities. Good ROM, no contractures. Normal muscle tone.  Skin: no rashes, lesions, ulcers. No induration Neurologic: CN 2-12 grossly intact. Sensation intact, DTR normal. Strength 5/5 in all 4.  Psychiatric: Normal judgment and insight. Alert and oriented x 3. Normal mood.    Impression and Plan:  Encounter for preventive health examination  -She has routine eye and dental care.   -All immunizations are  up-to-date and age-appropriate. -Screening labs today. -Healthy lifestyle discussed in detail. -Commence routine colon cancer screening age 68. -She had a negative mammogram in December 2021. -She follows with GYN for her cervical cancer screening and well woman exams.  Hyperlipidemia, unspecified hyperlipidemia type  -Check lipids today, consider starting atorvastatin pending results.  Vitamin D deficiency  - Plan: VITAMIN D 25 Hydroxy (Vit-D Deficiency, Fractures)  Vitamin B12 deficiency   -Plan: Vitamin B12 -B12 injection in office today.  Obesity (BMI 30.0-34.9) -Discussed healthy lifestyle, including increased physical activity and better food choices to promote weight loss.    Patient Instructions  -Nice seeing you today!!  -Lab work today; will notify you once results are available.  -Schedule follow up in 6 months.      [name], MD [facility] at [facility]

## 2021-03-19 DIAGNOSIS — Z20822 Contact with and (suspected) exposure to covid-19: Secondary | ICD-10-CM | POA: Diagnosis not present

## 2021-03-19 DIAGNOSIS — Z03818 Encounter for observation for suspected exposure to other biological agents ruled out: Secondary | ICD-10-CM | POA: Diagnosis not present

## 2021-04-26 DIAGNOSIS — Z03818 Encounter for observation for suspected exposure to other biological agents ruled out: Secondary | ICD-10-CM | POA: Diagnosis not present

## 2021-04-26 DIAGNOSIS — Z20822 Contact with and (suspected) exposure to covid-19: Secondary | ICD-10-CM | POA: Diagnosis not present

## 2021-05-26 ENCOUNTER — Other Ambulatory Visit: Payer: Self-pay | Admitting: Internal Medicine

## 2021-05-26 DIAGNOSIS — E559 Vitamin D deficiency, unspecified: Secondary | ICD-10-CM

## 2021-06-05 ENCOUNTER — Encounter: Payer: Self-pay | Admitting: Internal Medicine

## 2021-06-05 DIAGNOSIS — Z20822 Contact with and (suspected) exposure to covid-19: Secondary | ICD-10-CM | POA: Diagnosis not present

## 2021-06-05 DIAGNOSIS — Z03818 Encounter for observation for suspected exposure to other biological agents ruled out: Secondary | ICD-10-CM | POA: Diagnosis not present

## 2021-06-06 ENCOUNTER — Telehealth (INDEPENDENT_AMBULATORY_CARE_PROVIDER_SITE_OTHER): Payer: BLUE CROSS/BLUE SHIELD | Admitting: Internal Medicine

## 2021-06-06 ENCOUNTER — Encounter: Payer: Self-pay | Admitting: Internal Medicine

## 2021-06-06 VITALS — Temp 100.3°F | Wt 225.0 lb

## 2021-06-06 DIAGNOSIS — U071 COVID-19: Secondary | ICD-10-CM | POA: Diagnosis not present

## 2021-06-06 MED ORDER — MOLNUPIRAVIR EUA 200MG CAPSULE: 4.0000 | ORAL_CAPSULE | Freq: Two times a day (BID) | ORAL | 0 refills | Status: AC

## 2021-06-06 NOTE — Progress Notes (Signed)
Virtual Visit via Video Note  I connected with Linda Winters on 06/06/21 at  8:30 AM EDT by a video enabled telemedicine application and verified that I am speaking with the correct person using two identifiers.  Location patient: home Location provider: work office Persons participating in the virtual visit: patient, provider  I discussed the limitations of evaluation and management by telemedicine and the availability of in person appointments. The patient expressed understanding and agreed to proceed.   HPI: She has scheduled this visit to let me know that she has tested positive for COVID.  Her husband tested positive on Monday.  The next day on Tuesday she started having sinus congestion.  Yesterday she had headache and a temperature of 100.5.  She has done 3 home COVID tests that are positive.   ROS: Constitutional: Positive for fever, chills, appetite change and fatigue.  HEENT: Denies photophobia, eye pain, redness,  mouth sores, trouble swallowing, neck pain, neck stiffness and tinnitus.   Respiratory: Denies SOB, DOE,  chest tightness,  and wheezing.   Cardiovascular: Denies chest pain, palpitations and leg swelling.  Gastrointestinal: Denies nausea, vomiting, abdominal pain, diarrhea, constipation, blood in stool and abdominal distention.  Genitourinary: Denies dysuria, urgency, frequency, hematuria, flank pain and difficulty urinating.  Endocrine: Denies: hot or cold intolerance, sweats, changes in hair or nails, polyuria, polydipsia. Musculoskeletal: Denies myalgias, back pain, joint swelling, arthralgias and gait problem.  Skin: Denies pallor, rash and wound.  Neurological: Denies dizziness, seizures, syncope, weakness, light-headedness, numbness and headaches.  Hematological: Denies adenopathy. Easy bruising, personal or family bleeding history  Psychiatric/Behavioral: Denies suicidal ideation, mood changes, confusion, nervousness, sleep disturbance and  agitation   Past Medical History:  Diagnosis Date   Anxiety    Depression    Endometriosis    Hemoglobin C trait ([facility])    NSVD (normal spontaneous vaginal delivery)    X 2    Past Surgical History:  Procedure Laterality Date   CYSTOSCOPY N/A 05/26/2017   Procedure: CYSTOSCOPY;  Surgeon: Anastasio Auerbach, MD;  Location: [facility];  Service: Gynecology;  Laterality: N/A;   LAPAROSCOPIC VAGINAL HYSTERECTOMY WITH SALPINGECTOMY Bilateral 05/26/2017   Procedure: LAPAROSCOPIC ASSISTED VAGINAL HYSTERECTOMY WITH SALPINGECTOMY;  Surgeon: Anastasio Auerbach, MD;  Location: [facility];  Service: Gynecology;  Laterality: Bilateral;  requests 7:30am OR time  requests 2 1/2 hours   PELVIC LAPAROSCOPY     WISDOM TOOTH EXTRACTION      Family History  Problem Relation Age of Onset   Diabetes Father    Hypertension Father    Heart disease Father    Cancer Maternal Uncle        throat   Cancer Maternal Grandfather        prostate   Hypertension Mother    Breast cancer Neg Hx     SOCIAL HX:   reports that she quit smoking about 10 years ago. Her smoking use included cigarettes. She smoked an average of .25 packs per day. She has never used smokeless tobacco. She reports current alcohol use. She reports that she does not use drugs.   Current Outpatient Medications:    cyanocobalamin (,VITAMIN B-12,) 1000 MCG/ML injection, Inject 1 mL (1,000 mcg total) into the muscle every 30 (thirty) days., Disp: 6 mL, Rfl: 1   ibuprofen (ADVIL,MOTRIN) 600 MG tablet, Take 600 mg by mouth 4 (four) times daily as needed for cramping., Disp: , Rfl:    molnupiravir EUA (LAGEVRIO) 200 mg CAPS capsule, Take 4  capsules (800 mg total) by mouth 2 (two) times daily for 5 days., Disp: 40 capsule, Rfl: 0   Multiple Vitamin (MULTIVITAMIN WITH MINERALS) TABS tablet, Take 1 tablet by mouth daily., Disp: , Rfl:    NEEDLE, DISP, 25 G (BD ECLIPSE NEEDLE) 25G X 5/8" MISC, Use once monthly for b12 injections, Disp: 100 each, Rfl: 1    Vitamin D, Ergocalciferol, (DRISDOL) 1.25 MG (50000 UNIT) CAPS capsule, TAKE 1 CAPSULE (50,000 UNITS TOTAL) BY MOUTH EVERY 7 (SEVEN) DAYS FOR 12 DOSES., Disp: 4 capsule, Rfl: 2   atorvastatin (LIPITOR) 20 MG tablet, Take 1 tablet (20 mg total) by mouth daily., Disp: 90 tablet, Rfl: 3  EXAM:   VITALS per patient if applicable: Temp of [id number]  GENERAL: alert, oriented, appears well and in no acute distress, sounds congested  HEENT: atraumatic, conjunttiva clear, no obvious abnormalities on inspection of external nose and ears  NECK: normal movements of the head and neck  LUNGS: on inspection no signs of respiratory distress, breathing rate appears normal, no obvious gross increased work of breathing, gasping or wheezing  CV: no obvious cyanosis  MS: moves all visible extremities without noticeable abnormality  PSYCH/NEURO: pleasant and cooperative, no obvious depression or anxiety, speech and thought processing grossly intact  ASSESSMENT AND PLAN:   COVID-19  - Plan: molnupiravir EUA (LAGEVRIO) 200 mg CAPS capsule -She may also use OTC medications such as antihistamines, decongestants, pain relievers, guaifenesin. -We have reviewed quarantine period of 5 days. -We have discussed symptoms that would promote ED evaluation. -She knows to follow with [place] if symptoms fail to resolve.      I discussed the assessment and treatment plan with the patient. The patient was provided an opportunity to ask questions and all were answered. The patient agreed with the plan and demonstrated an understanding of the instructions.   The patient was advised to call back or seek an in-person evaluation if the symptoms worsen or if the condition fails to improve as anticipated.    Linda Frohlich, MD  [facility] at [facility]

## 2021-06-10 ENCOUNTER — Encounter: Payer: Self-pay | Admitting: Internal Medicine

## 2021-06-12 DIAGNOSIS — Z03818 Encounter for observation for suspected exposure to other biological agents ruled out: Secondary | ICD-10-CM | POA: Diagnosis not present

## 2021-06-12 DIAGNOSIS — U071 COVID-19: Secondary | ICD-10-CM | POA: Diagnosis not present

## 2021-07-09 ENCOUNTER — Encounter: Payer: Self-pay | Admitting: Internal Medicine

## 2021-08-08 ENCOUNTER — Other Ambulatory Visit: Payer: Self-pay | Admitting: Internal Medicine

## 2021-08-08 DIAGNOSIS — Z1231 Encounter for screening mammogram for malignant neoplasm of breast: Secondary | ICD-10-CM

## 2021-08-11 ENCOUNTER — Other Ambulatory Visit: Payer: Self-pay | Admitting: Internal Medicine

## 2021-09-06 ENCOUNTER — Other Ambulatory Visit: Payer: Self-pay | Admitting: Internal Medicine

## 2021-09-06 DIAGNOSIS — E559 Vitamin D deficiency, unspecified: Secondary | ICD-10-CM

## 2021-09-18 ENCOUNTER — Ambulatory Visit
Admission: RE | Admit: 2021-09-18 | Discharge: 2021-09-18 | Disposition: A | Discharge status: Home / Self Care | Payer: BLUE CROSS/BLUE SHIELD | Source: Ambulatory Visit | Attending: Internal Medicine | Admitting: Internal Medicine

## 2021-09-18 DIAGNOSIS — Z1231 Encounter for screening mammogram for malignant neoplasm of breast: Secondary | ICD-10-CM

## 2021-10-01 ENCOUNTER — Encounter: Payer: Self-pay | Admitting: Internal Medicine

## 2021-10-01 MED ORDER — "BD DISP NEEDLES 25G X 5/8"" MISC": 0 refills | Status: DC

## 2021-10-02 ENCOUNTER — Encounter: Payer: Self-pay | Admitting: Internal Medicine

## 2021-10-03 ENCOUNTER — Other Ambulatory Visit: Payer: Self-pay

## 2021-10-03 DIAGNOSIS — E538 Deficiency of other specified B group vitamins: Secondary | ICD-10-CM

## 2021-10-03 DIAGNOSIS — E559 Vitamin D deficiency, unspecified: Secondary | ICD-10-CM

## 2021-10-03 DIAGNOSIS — E782 Mixed hyperlipidemia: Secondary | ICD-10-CM

## 2021-11-07 ENCOUNTER — Other Ambulatory Visit: Payer: BLUE CROSS/BLUE SHIELD

## 2021-11-07 DIAGNOSIS — E538 Deficiency of other specified B group vitamins: Secondary | ICD-10-CM

## 2021-11-07 DIAGNOSIS — E782 Mixed hyperlipidemia: Secondary | ICD-10-CM

## 2021-12-02 NOTE — Progress Notes (Deleted)
? ?  Linda Winters May 02, 1976 [id number] ? ? ?History:  46 y.o. [id number] presents for annual exam without GYN complaints. S/P 2018 LAVH with bilateral salpingectomies for endometriosis - no HRT. Normal pap history.  ? ?Gynecologic History ?Patient's last menstrual period was 05/08/2017 (exact date). ?  ?Contraception/Family planning: status post hysterectomy ?Sexually active: Yes ? ?Health Maintenance ?Last Pap: 2017. Results were: Normal ?Last mammogram: 09/18/2021. Results were: Normal ?Last colonoscopy: Never ?Last Dexa: Not indicated ? ?Past medical history, past surgical history, family history and social history were all reviewed and documented in the EPIC chart. ? ?ROS:  A ROS was performed and pertinent positives and negatives are included. ? ?Exam: ? ?There were no vitals filed for this visit. ? ?There is no height or weight on file to calculate BMI. ? ?General appearance:  Normal ?Thyroid:  Symmetrical, normal in size, without palpable masses or nodularity. ?Respiratory ? Auscultation:  Clear without wheezing or rhonchi ?Cardiovascular ? Auscultation:  Regular rate, without rubs, murmurs or gallops ? Edema/varicosities:  Not grossly evident ?Abdominal ? Soft,nontender, without masses, guarding or rebound. ? Liver/spleen:  No organomegaly noted ? Hernia:  None appreciated ? Skin ? Inspection:  Grossly normal ?  ?Breasts: Examined lying and sitting.  ? Right: Without masses, retractions, discharge or axillary adenopathy. ? ? Left: Without masses, retractions, discharge or axillary adenopathy. ?Gentitourinary  ? Inguinal/mons:  Normal without inguinal adenopathy ? External genitalia:  Normal ? BUS/Urethra/Skene's glands:  Normal ? Vagina:  Normal ? Cervix:  Absent ? Uterus:  Absent ? Adnexa/parametria:   ?  Rt: Without masses or tenderness. ?  Lt: Without masses or tenderness. ? Anus and perineum: Normal ? Digital rectal exam: Normal sphincter tone without palpated masses or tenderness ? ?Patient informed  chaperone available to be present for breast and pelvic exam. Patient has requested no chaperone to be present. Patient has been advised what will be completed during breast and pelvic exam.  ? ? ?Assessment/Plan:  46 y.o. [id number] for annual exam.  ? ?Well female exam with routine gynecological exam - Education provided on SBEs, importance of preventative screenings, current guidelines, high calcium diet, regular exercise, and multivitamin daily. Labs with PCP.  ? ?Screening for cervical cancer - Normal Pap history.  No longer screening per guidelines. ? ?Screening for breast cancer - Normal mammogram history.  Continue annual screenings.  Normal breast exam today. ? ?Follow up in 1 year for annual.  ? ? ? ?[facility], 2:45 PM 12/02/2021 ? ?

## 2021-12-03 ENCOUNTER — Ambulatory Visit: Payer: BLUE CROSS/BLUE SHIELD | Admitting: Nurse Practitioner

## 2021-12-03 DIAGNOSIS — Z01419 Encounter for gynecological examination (general) (routine) without abnormal findings: Secondary | ICD-10-CM

## 2021-12-11 ENCOUNTER — Other Ambulatory Visit: Payer: Self-pay | Admitting: *Deleted

## 2021-12-11 MED ORDER — CYANOCOBALAMIN 1000 MCG/ML IJ SOLN: 1000.0000 ug | INTRAMUSCULAR | 1 refills | Status: DC

## 2021-12-27 ENCOUNTER — Encounter: Payer: Self-pay | Admitting: Internal Medicine

## 2021-12-27 DIAGNOSIS — Z1211 Encounter for screening for malignant neoplasm of colon: Secondary | ICD-10-CM

## 2022-01-01 ENCOUNTER — Encounter: Payer: Self-pay | Admitting: Gastroenterology

## 2022-02-03 ENCOUNTER — Ambulatory Visit (AMBULATORY_SURGERY_CENTER): Payer: BLUE CROSS/BLUE SHIELD | Admitting: *Deleted

## 2022-02-03 VITALS — Ht 69.0 in | Wt 210.0 lb

## 2022-02-03 DIAGNOSIS — Z1211 Encounter for screening for malignant neoplasm of colon: Secondary | ICD-10-CM

## 2022-02-03 MED ORDER — NA SULFATE-K SULFATE-MG SULF 17.5-3.13-1.6 GM/177ML PO SOLN: 2.0000 | Freq: Once | ORAL | 0 refills | Status: AC

## 2022-02-03 NOTE — Progress Notes (Signed)
No egg or soy allergy known to patient  ?No issues known to pt with past sedation with any surgeries or procedures ?Patient denies ever being told they had issues or difficulty with intubation  ?No FH of Malignant Hyperthermia ?Pt is not on diet pills ?Pt is not on  home 02  ?Pt is not on blood thinners  ?Pt denies issues with constipation  ?No A fib or A flutter ? ? ?Pt instructed to use Singlecare.com or GoodRx for a price reduction on prep  ? ?PV completed over the phone. Pt verified name, DOB, address and insurance during PV today.  ? ?Pt encouraged to call with questions or issues.  ?If pt has My chart, procedure instructions sent via My Chart  ?Insurance confirmed with pt at [facility] today   ?

## 2022-02-21 ENCOUNTER — Encounter: Payer: Self-pay | Admitting: Gastroenterology

## 2022-02-24 ENCOUNTER — Encounter: Payer: Self-pay | Admitting: Gastroenterology

## 2022-02-24 ENCOUNTER — Ambulatory Visit (AMBULATORY_SURGERY_CENTER): Payer: BLUE CROSS/BLUE SHIELD | Admitting: Gastroenterology

## 2022-02-24 VITALS — BP 128/84 | HR 75 | Temp 97.1°F | Resp 11 | Ht 68.0 in | Wt 210.0 lb

## 2022-02-24 DIAGNOSIS — K635 Polyp of colon: Secondary | ICD-10-CM

## 2022-02-24 DIAGNOSIS — Z1211 Encounter for screening for malignant neoplasm of colon: Secondary | ICD-10-CM

## 2022-02-24 DIAGNOSIS — D127 Benign neoplasm of rectosigmoid junction: Secondary | ICD-10-CM

## 2022-02-24 MED ORDER — SODIUM CHLORIDE 0.9 % IV SOLN: 500.0000 mL | Freq: Once | INTRAVENOUS | Status: DC

## 2022-02-24 NOTE — Progress Notes (Signed)
Called to room to assist during endoscopic procedure.  Patient ID and intended procedure confirmed with present staff. Received instructions for my participation in the procedure from the performing physician.  

## 2022-02-24 NOTE — Patient Instructions (Signed)
Please read handout provided. Continue present medications. Await pathology results.   YOU HAD AN ENDOSCOPIC PROCEDURE TODAY AT [facility]:   Refer to the procedure report that was given to you for any specific questions about what was found during the examination.  If the procedure report does not answer your questions, please call your gastroenterologist to clarify.  If you requested that your care partner not be given the details of your procedure findings, then the procedure report has been included in a sealed envelope for you to review at your convenience later.  YOU SHOULD EXPECT: Some feelings of bloating in the abdomen. Passage of more gas than usual.  Walking can help get rid of the air that was put into your GI tract during the procedure and reduce the bloating. If you had a lower endoscopy (such as a colonoscopy or flexible sigmoidoscopy) you may notice spotting of blood in your stool or on the toilet paper. If you underwent a bowel prep for your procedure, you may not have a normal bowel movement for a few days.  Please Note:  You might notice some irritation and congestion in your nose or some drainage.  This is from the oxygen used during your procedure.  There is no need for concern and it should clear up in a day or so.  SYMPTOMS TO REPORT IMMEDIATELY:  Following lower endoscopy (colonoscopy or flexible sigmoidoscopy):  Excessive amounts of blood in the stool  Significant tenderness or worsening of abdominal pains  Swelling of the abdomen that is new, acute  Fever of 100F or higher  For urgent or emergent issues, a gastroenterologist can be reached at any hour by calling [phone]. Do not use MyChart messaging for urgent concerns.    DIET:  We do recommend a small meal at first, but then you may proceed to your regular diet.  Drink plenty of fluids but you should avoid alcoholic beverages for 24 hours.  ACTIVITY:  You should plan to take it easy for  the rest of today and you should NOT DRIVE or use heavy machinery until tomorrow (because of the sedation medicines used during the test).    FOLLOW UP: Our staff will call the number listed on your records 24-72 hours following your procedure to check on you and address any questions or concerns that you may have regarding the information given to you following your procedure. If we do not reach you, we will leave a message.  We will attempt to reach you two times.  During this call, we will ask if you have developed any symptoms of COVID 19. If you develop any symptoms (ie: fever, flu-like symptoms, shortness of breath, cough etc.) before then, please call [phone].  If you test positive for Covid 19 in the 2 weeks post procedure, please call and report this information to [place].    If any biopsies were taken you will be contacted by phone or by letter within the next 1-3 weeks.  Please call us at [phone] if you have not heard about the biopsies in 3 weeks.    SIGNATURES/CONFIDENTIALITY: You and/or your care partner have signed paperwork which will be entered into your electronic medical record.  These signatures attest to the fact that that the information above on your After Visit Summary has been reviewed and is understood.  Full responsibility of the confidentiality of this discharge information lies with you and/or your care-partner.

## 2022-02-24 NOTE — Progress Notes (Signed)
[facility] History and Physical   Primary Care Physician:  Isaac Bliss, Rayford Halsted, MD   Reason for Procedure:   Colon cancer screening  Plan:    colonoscopy     HPI: Linda Winters is a 46 y.o. female  here for colonoscopy screening - first time exam. Patient denies any bowel symptoms at this time. No family history of colon cancer knownin first degree relatives, aunt may have had colon cancer she is not sure. Otherwise feels well without any cardiopulmonary symptoms.    Past Medical History:  Diagnosis Date   Anxiety    Depression    Endometriosis    Hemoglobin C trait (HCC)    Hyperlipidemia    NSVD (normal spontaneous vaginal delivery)    X 2    Past Surgical History:  Procedure Laterality Date   CYSTOSCOPY N/A 05/26/2017   Procedure: CYSTOSCOPY;  Surgeon: Anastasio Auerbach, MD;  Location: [facility];  Service: Gynecology;  Laterality: N/A;   LAPAROSCOPIC VAGINAL HYSTERECTOMY WITH SALPINGECTOMY Bilateral 05/26/2017   Procedure: LAPAROSCOPIC ASSISTED VAGINAL HYSTERECTOMY WITH SALPINGECTOMY;  Surgeon: Anastasio Auerbach, MD;  Location: [facility];  Service: Gynecology;  Laterality: Bilateral;  requests 7:30am OR time  requests 2 1/2 hours   PELVIC LAPAROSCOPY     WISDOM TOOTH EXTRACTION      Prior to Admission medications   Medication Sig Start Date End Date Taking? Authorizing Provider  Probiotic Product (ALIGN) 4 MG CAPS Take by mouth.   Yes [provider]  atorvastatin (LIPITOR) 20 MG tablet Take 1 tablet (20 mg total) by mouth daily. 03/01/21 05/30/21  Isaac Bliss, Rayford Halsted, MD  cyanocobalamin (,VITAMIN B-12,) 1000 MCG/ML injection Inject 1 mL (1,000 mcg total) into the muscle every 30 (thirty) days. 12/11/21   Isaac Bliss, Rayford Halsted, MD  ibuprofen (ADVIL,MOTRIN) 600 MG tablet Take 600 mg by mouth 4 (four) times daily as needed for cramping.    [provider]  Multiple Vitamin (MULTIVITAMIN WITH MINERALS) TABS tablet Take 1 tablet by  mouth daily. Patient not taking: Reported on 02/03/2022    [provider]  NEEDLE, DISP, 25 G (BD DISP NEEDLES) 25G X 5/8" MISC USE ONCE MONTHLY FOR B12 INJECTIONS 10/01/21   Isaac Bliss, Rayford Halsted, MD    Current Outpatient Medications  Medication Sig Dispense Refill   Probiotic Product (ALIGN) 4 MG CAPS Take by mouth.     atorvastatin (LIPITOR) 20 MG tablet Take 1 tablet (20 mg total) by mouth daily. 90 tablet 3   cyanocobalamin (,VITAMIN B-12,) 1000 MCG/ML injection Inject 1 mL (1,000 mcg total) into the muscle every 30 (thirty) days. 6 mL 1   ibuprofen (ADVIL,MOTRIN) 600 MG tablet Take 600 mg by mouth 4 (four) times daily as needed for cramping.     Multiple Vitamin (MULTIVITAMIN WITH MINERALS) TABS tablet Take 1 tablet by mouth daily. (Patient not taking: Reported on 02/03/2022)     NEEDLE, DISP, 25 G (BD DISP NEEDLES) 25G X 5/8" MISC USE ONCE MONTHLY FOR B12 INJECTIONS 6 each 0   Current Facility-Administered Medications  Medication Dose Route Frequency Provider Last Rate Last Admin   0.9 %  sodium chloride infusion  500 mL Intravenous Once Algis Lehenbauer, Carlota Raspberry, MD        Allergies as of 02/24/2022 - Review Complete 02/24/2022  Allergen Reaction Noted   Compazine [prochlorperazine edisylate] Anxiety 11/06/2015    Family History  Problem Relation Age of Onset   Hypertension Mother    Diabetes Father  Hypertension Father    Heart disease Father    Colon polyps Sister    Esophageal cancer Maternal Uncle    Cancer Maternal Uncle        throat   Cancer Maternal Grandfather        prostate   Breast cancer Neg Hx    Colon cancer Neg Hx    Stomach cancer Neg Hx    Rectal cancer Neg Hx     Social History   Socioeconomic History   Marital status: Married    Spouse name: Not on file   Number of children: Not on file   Years of education: Not on file   Highest education level: Not on file  Occupational History   Not on file  Tobacco Use   Smoking status:  Former    Packs/day: 0.25    Types: Cigarettes    Quit date: 02/06/2011    Years since quitting: 11.0   Smokeless tobacco: Never  Vaping Use   Vaping Use: Never used  Substance and Sexual Activity   Alcohol use: Yes    Alcohol/week: 0.0 standard drinks    Comment: Social   Drug use: No   Sexual activity: Yes    Birth control/protection: Surgical    Comment: 1st intercourse 46 yo-More than 5 partners-Hyst  Other Topics Concern   Not on file  Social History Narrative   Not on file   Social Determinants of Health   Financial Resource Strain: Not on file  Food Insecurity: Not on file  Transportation Needs: Not on file  Physical Activity: Not on file  Stress: Not on file  Social Connections: Not on file  Intimate Partner Violence: Not on file    Review of Systems: All other review of systems negative except as mentioned in the HPI.  Physical Exam: Vital signs BP 107/61   Pulse 90   Temp (!) 97.1 F (36.2 C) (Temporal)   Ht '5\' 8"'$  (1.727 m)   Wt 210 lb (95.3 kg)   LMP 05/08/2017 (Exact Date) Comment: GYN - [facility]  SpO2 100%   BMI 31.93 kg/m   General:   Alert,  Well-developed, pleasant and cooperative in NAD Lungs:  Clear throughout to auscultation.   Heart:  Regular rate and rhythm Abdomen:  Soft, nontender and nondistended.   Neuro/Psych:  Alert and cooperative. Normal mood and affect. A and O x 3  Jolly Mango, MD [facility]

## 2022-02-24 NOTE — Op Note (Signed)
[facility] Patient Name: Linda Winters Procedure Date: 02/24/2022 9:38 AM MRN: [id number] Endoscopist: Remo Lipps P. Havery Moros , MD Age: 46 Referring MD:  Date of Birth: 09/08/1976 Gender: Female Account #: [id number] Procedure:                Colonoscopy Indications:              Screening for colorectal malignant neoplasm, This                            is the patient's first colonoscopy Medicines:                Monitored Anesthesia Care Procedure:                Pre-Anesthesia Assessment:                           - Prior to the procedure, a History and Physical                            was performed, and patient medications and                            allergies were reviewed. The patient's tolerance of                            previous anesthesia was also reviewed. The risks                            and benefits of the procedure and the sedation                            options and risks were discussed with the patient.                            All questions were answered, and informed consent                            was obtained. Prior Anticoagulants: The patient has                            taken no previous anticoagulant or antiplatelet                            agents. ASA Grade Assessment: II - A patient with                            mild systemic disease. After reviewing the risks                            and benefits, the patient was deemed in                            satisfactory condition to undergo the procedure.  After obtaining informed consent, the colonoscope                            was passed under direct vision. Throughout the                            procedure, the patient's blood pressure, pulse, and                            oxygen saturations were monitored continuously. The                            Olympus PCF-H190DL (#[id number]) Colonoscope was                            introduced through the  anus and advanced to the the                            cecum, identified by appendiceal orifice and                            ileocecal valve. The colonoscopy was performed                            without difficulty. The patient tolerated the                            procedure well. The quality of the bowel                            preparation was adequate. The ileocecal valve,                            appendiceal orifice, and rectum were photographed. Scope In: 9:45:35 AM Scope Out: 10:09:30 AM Scope Withdrawal Time: 0 hours 20 minutes 6 seconds  Total Procedure Duration: 0 hours 23 minutes 55 seconds  Findings:                 The perianal and digital rectal examinations were                            normal.                           Scattered medium-mouthed diverticula were found in                            the transverse colon and left colon.                           Two sessile polyps were found in the recto-sigmoid                            colon. The polyps were 3 to 4 mm in size. These  polyps were removed with a cold snare. Resection                            and retrieval were complete.                           Internal hemorrhoids were found during                            retroflexion. The hemorrhoids were small.                           The exam was otherwise without abnormality. Complications:            No immediate complications. Estimated blood loss:                            Minimal. Estimated Blood Loss:     Estimated blood loss was minimal. Impression:               - Diverticulosis in the transverse colon and in the                            left colon.                           - Two 3 to 4 mm polyps at the recto-sigmoid colon,                            removed with a cold snare. Resected and retrieved.                           - Internal hemorrhoids.                           - The examination was otherwise  normal. Recommendation:           - Patient has a contact number available for                            emergencies. The signs and symptoms of potential                            delayed complications were discussed with the                            patient. Return to normal activities tomorrow.                            Written discharge instructions were provided to the                            patient.                           - Resume previous diet.                           -  Continue present medications.                           - Await pathology results. Remo Lipps P. Brennen Gardiner, MD 02/24/2022 10:12:22 AM This report has been signed electronically.

## 2022-02-24 NOTE — Progress Notes (Signed)
Pt's states no medical or surgical changes since previsit or office visit. 

## 2022-02-24 NOTE — Progress Notes (Signed)
To pacu, VSS. Report to [email or web address] 

## 2022-02-25 ENCOUNTER — Telehealth: Payer: Self-pay

## 2022-02-25 NOTE — Telephone Encounter (Signed)
  Follow up Call-     02/24/2022    8:50 AM  Call back number  Post procedure Call Back phone  # [phone]  Permission to leave phone message Yes     Patient questions:  Do you have a fever, pain , or abdominal swelling? No. Pain Score  0 *  Have you tolerated food without any problems? Yes.    Have you been able to return to your normal activities? Yes.    Do you have any questions about your discharge instructions: Diet   No. Medications  No. Follow up visit  No.  Do you have questions or concerns about your Care? No.  Actions: * If pain score is 4 or above: No action needed, pain <4.

## 2022-02-25 NOTE — Telephone Encounter (Signed)
Attempted f/u call. No answer, left VM. 

## 2022-07-02 ENCOUNTER — Encounter: Payer: Self-pay | Admitting: Internal Medicine

## 2022-07-02 ENCOUNTER — Ambulatory Visit (INDEPENDENT_AMBULATORY_CARE_PROVIDER_SITE_OTHER): Payer: BLUE CROSS/BLUE SHIELD | Admitting: Internal Medicine

## 2022-07-02 VITALS — BP 118/76 | HR 94 | Temp 98.6°F | Resp 16 | Ht 68.0 in | Wt 215.1 lb

## 2022-07-02 DIAGNOSIS — Z0000 Encounter for general adult medical examination without abnormal findings: Secondary | ICD-10-CM

## 2022-07-02 DIAGNOSIS — E559 Vitamin D deficiency, unspecified: Secondary | ICD-10-CM

## 2022-07-02 DIAGNOSIS — E785 Hyperlipidemia, unspecified: Secondary | ICD-10-CM | POA: Diagnosis not present

## 2022-07-02 DIAGNOSIS — R221 Localized swelling, mass and lump, neck: Secondary | ICD-10-CM | POA: Diagnosis not present

## 2022-07-02 DIAGNOSIS — E538 Deficiency of other specified B group vitamins: Secondary | ICD-10-CM

## 2022-07-02 NOTE — Addendum Note (Signed)
Addended by: Octavio Manns E on: 07/02/2022 02:05 PM   Modules accepted: Orders

## 2022-07-02 NOTE — Progress Notes (Signed)
Established Patient Office Visit     CC/Reason for Visit: Annual preventive exam  HPI: Linda Winters is a 46 y.o. female who is coming in today for the above mentioned reasons. Past Medical History is significant for: Hyperlipidemia, mild obesity, vitamin D and B12 deficiencies.  She has been feeling well.  She has noticed a fullness at the base of her neck right greater than left that is mildly tender to palpation.  She is overdue for an eye exam, has routine dental care.  She is due for flu, COVID, Tdap vaccines.  All cancer screening is up-to-date.   Past Medical/Surgical History: Past Medical History:  Diagnosis Date   Anxiety    Depression    Endometriosis    Hemoglobin C trait (HCC)    Hyperlipidemia    NSVD (normal spontaneous vaginal delivery)    X 2    Past Surgical History:  Procedure Laterality Date   CYSTOSCOPY N/A 05/26/2017   Procedure: CYSTOSCOPY;  Surgeon: Anastasio Auerbach, MD;  Location: [facility];  Service: Gynecology;  Laterality: N/A;   LAPAROSCOPIC VAGINAL HYSTERECTOMY WITH SALPINGECTOMY Bilateral 05/26/2017   Procedure: LAPAROSCOPIC ASSISTED VAGINAL HYSTERECTOMY WITH SALPINGECTOMY;  Surgeon: Anastasio Auerbach, MD;  Location: [facility];  Service: Gynecology;  Laterality: Bilateral;  requests 7:30am OR time  requests 2 1/2 hours   PELVIC LAPAROSCOPY     WISDOM TOOTH EXTRACTION      Social History:  reports that she quit smoking about 11 years ago. Her smoking use included cigarettes. She smoked an average of .25 packs per day. She has never used smokeless tobacco. She reports current alcohol use. She reports that she does not use drugs.  Allergies: Allergies  Allergen Reactions   Compazine [Prochlorperazine Edisylate] Anxiety    Family History:  Family History  Problem Relation Age of Onset   Hypertension Mother    Diabetes Father    Hypertension Father    Heart disease Father    Colon polyps Sister    Esophageal cancer Maternal Uncle     Cancer Maternal Uncle        throat   Cancer Maternal Grandfather        prostate   Breast cancer Neg Hx    Colon cancer Neg Hx    Stomach cancer Neg Hx    Rectal cancer Neg Hx      Current Outpatient Medications:    cyanocobalamin (,VITAMIN B-12,) 1000 MCG/ML injection, Inject 1 mL (1,000 mcg total) into the muscle every 30 (thirty) days., Disp: 6 mL, Rfl: 1   ibuprofen (ADVIL,MOTRIN) 600 MG tablet, Take 600 mg by mouth 4 (four) times daily as needed for cramping., Disp: , Rfl:    NEEDLE, DISP, 25 G (BD DISP NEEDLES) 25G X 5/8" MISC, USE ONCE MONTHLY FOR B12 INJECTIONS, Disp: 6 each, Rfl: 0   Probiotic Product (ALIGN) 4 MG CAPS, Take by mouth., Disp: , Rfl:   Review of Systems:  Constitutional: Denies fever, chills, diaphoresis, appetite change and fatigue.  HEENT: Denies photophobia, eye pain, redness, hearing loss, ear pain, congestion, sore throat, rhinorrhea, sneezing, mouth sores, trouble swallowing,  neck stiffness and tinnitus.   Respiratory: Denies SOB, DOE, cough, chest tightness,  and wheezing.   Cardiovascular: Denies chest pain, palpitations and leg swelling.  Gastrointestinal: Denies nausea, vomiting, abdominal pain, diarrhea, constipation, blood in stool and abdominal distention.  Genitourinary: Denies dysuria, urgency, frequency, hematuria, flank pain and difficulty urinating.  Endocrine: Denies: hot or cold intolerance, sweats,  changes in hair or nails, polyuria, polydipsia. Musculoskeletal: Denies myalgias, back pain, joint swelling, arthralgias and gait problem.  Skin: Denies pallor, rash and wound.  Neurological: Denies dizziness, seizures, syncope, weakness, light-headedness, numbness and headaches.  Hematological: Denies adenopathy. Easy bruising, personal or family bleeding history  Psychiatric/Behavioral: Denies suicidal ideation, mood changes, confusion, nervousness, sleep disturbance and agitation    Physical Exam: Vitals:   07/02/22 1304  BP: 118/76   Pulse: 94  Resp: 16  Temp: 98.6 F (37 C)  TempSrc: Oral  SpO2: 97%  Weight: 215 lb 2 oz (97.6 kg)  Height: '5\' 8"'$  (1.727 m)    Body mass index is 32.71 kg/m.   Constitutional: NAD, calm, comfortable Eyes: PERRL, lids and conjunctivae normal ENMT: Mucous membranes are moist. Posterior pharynx clear of any exudate or lesions. Normal dentition. Tympanic membrane is pearly white, no erythema or bulging. Neck: normal, supple, no masses, no thyromegaly, there appears to be fullness to base of neck. Respiratory: clear to auscultation bilaterally, no wheezing, no crackles. Normal respiratory effort. No accessory muscle use.  Cardiovascular: Regular rate and rhythm, no murmurs / rubs / gallops. No extremity edema. 2+ pedal pulses. No carotid bruits.  Abdomen: no tenderness, no masses palpated. No hepatosplenomegaly. Bowel sounds positive.  Musculoskeletal: no clubbing / cyanosis. No joint deformity upper and lower extremities. Good ROM, no contractures. Normal muscle tone.  Skin: no rashes, lesions, ulcers. No induration Neurologic: CN 2-12 grossly intact. Sensation intact, DTR normal. Strength 5/5 in all 4.  Psychiatric: Normal judgment and insight. Alert and oriented x 3. Normal mood.    [place] Visit from 07/02/2022 in [place] at [place]  PHQ-9 Total Score 1        Impression and Plan:  Encounter for preventive health examination - Plan: Hemoglobin A1c  Neck fullness - Plan: US Soft Tissue Head/Neck (NON-THYROID), TSH  Vitamin B12 deficiency - Plan: Vitamin B12  Vitamin D deficiency - Plan: VITAMIN D 25 Hydroxy (Vit-D Deficiency, Fractures)  Hyperlipidemia, unspecified hyperlipidemia type - Plan: CBC with Differential/Platelet, Comprehensive metabolic panel, Lipid panel   -Recommend routine eye and dental care. -Immunizations: She will be getting all of her vaccines updated at pharmacy. -Healthy lifestyle discussed in detail. -Labs to be  updated today. -Colon cancer screening: 02/2022 -Breast cancer screening: 08/2021 -Cervical cancer screening: 09/2018 -Lung cancer screening: Not applicable -Prostate cancer screening: Not applicable -DEXA: Not applicable  -I will order a soft tissue x-ray of her neck to evaluate the fullness of her lower neck.  This appears to be adipose tissue and sits lower than where her thyroid would be.    [name], MD [facility]

## 2022-07-04 ENCOUNTER — Other Ambulatory Visit (INDEPENDENT_AMBULATORY_CARE_PROVIDER_SITE_OTHER): Payer: BLUE CROSS/BLUE SHIELD

## 2022-07-04 ENCOUNTER — Encounter: Payer: Self-pay | Admitting: Internal Medicine

## 2022-07-04 DIAGNOSIS — E538 Deficiency of other specified B group vitamins: Secondary | ICD-10-CM

## 2022-07-04 DIAGNOSIS — E559 Vitamin D deficiency, unspecified: Secondary | ICD-10-CM | POA: Diagnosis not present

## 2022-07-04 DIAGNOSIS — Z0000 Encounter for general adult medical examination without abnormal findings: Secondary | ICD-10-CM

## 2022-07-04 DIAGNOSIS — E785 Hyperlipidemia, unspecified: Secondary | ICD-10-CM

## 2022-07-04 DIAGNOSIS — R221 Localized swelling, mass and lump, neck: Secondary | ICD-10-CM | POA: Diagnosis not present

## 2022-07-04 LAB — LIPID PANEL
Cholesterol: 179 mg/dL (ref 0–200)
HDL: 41.7 mg/dL
LDL Cholesterol: 122 mg/dL — ABNORMAL HIGH (ref 0–99)
NonHDL: 137.64
Total CHOL/HDL Ratio: 4
Triglycerides: 79 mg/dL (ref 0.0–149.0)
VLDL: 15.8 mg/dL (ref 0.0–40.0)

## 2022-07-04 LAB — CBC WITH DIFFERENTIAL/PLATELET
Basophils Absolute: 0 10*3/uL (ref 0.0–0.1)
Basophils Relative: 0.3 % (ref 0.0–3.0)
Eosinophils Absolute: 0.6 10*3/uL (ref 0.0–0.7)
Eosinophils Relative: 5.4 % — ABNORMAL HIGH (ref 0.0–5.0)
HCT: 37.5 % (ref 36.0–46.0)
Hemoglobin: 12.8 g/dL (ref 12.0–15.0)
Lymphocytes Relative: 26.5 % (ref 12.0–46.0)
Lymphs Abs: 3.1 10*3/uL (ref 0.7–4.0)
MCHC: 34.1 g/dL (ref 30.0–36.0)
MCV: 86.9 fl (ref 78.0–100.0)
Monocytes Absolute: 0.6 10*3/uL (ref 0.1–1.0)
Monocytes Relative: 5 % (ref 3.0–12.0)
Neutro Abs: 7.2 10*3/uL (ref 1.4–7.7)
Neutrophils Relative %: 62.8 % (ref 43.0–77.0)
Platelets: 308 10*3/uL (ref 150.0–400.0)
RBC: 4.32 Mil/uL (ref 3.87–5.11)
RDW: 13.7 % (ref 11.5–15.5)
WBC: 11.5 10*3/uL — ABNORMAL HIGH (ref 4.0–10.5)

## 2022-07-04 LAB — COMPREHENSIVE METABOLIC PANEL
ALT: 7 U/L (ref 0–35)
AST: 13 U/L (ref 0–37)
Albumin: 4.2 g/dL (ref 3.5–5.2)
Alkaline Phosphatase: 62 U/L (ref 39–117)
BUN: 11 mg/dL (ref 6–23)
CO2: 27 mEq/L (ref 19–32)
Calcium: 9.3 mg/dL (ref 8.4–10.5)
Chloride: 104 mEq/L (ref 96–112)
Creatinine, Ser: 0.58 mg/dL (ref 0.40–1.20)
GFR: 109.05 mL/min (ref >60.00)
Glucose, Bld: 102 mg/dL — ABNORMAL HIGH (ref 70–99)
Potassium: 4.3 mEq/L (ref 3.5–5.1)
Sodium: 137 mEq/L (ref 135–145)
Total Bilirubin: 0.7 mg/dL (ref 0.2–1.2)
Total Protein: 7.2 g/dL (ref 6.0–8.3)

## 2022-07-04 LAB — VITAMIN D 25 HYDROXY (VIT D DEFICIENCY, FRACTURES): VITD: 25.88 ng/mL — ABNORMAL LOW (ref 30.00–100.00)

## 2022-07-04 LAB — VITAMIN B12: Vitamin B-12: 500 pg/mL (ref 211–911)

## 2022-07-04 LAB — HEMOGLOBIN A1C: Hgb A1c MFr Bld: 5.7 % (ref 4.6–6.5)

## 2022-07-04 LAB — TSH: TSH: 1.12 u[IU]/mL (ref 0.35–5.50)

## 2022-07-06 ENCOUNTER — Ambulatory Visit (HOSPITAL_BASED_OUTPATIENT_CLINIC_OR_DEPARTMENT_OTHER): Payer: BLUE CROSS/BLUE SHIELD

## 2022-07-08 ENCOUNTER — Other Ambulatory Visit: Payer: Self-pay | Admitting: Internal Medicine

## 2022-07-08 DIAGNOSIS — E559 Vitamin D deficiency, unspecified: Secondary | ICD-10-CM

## 2022-07-08 MED ORDER — VITAMIN D (ERGOCALCIFEROL) 1.25 MG (50000 UNIT) PO CAPS: 50000.0000 [IU] | ORAL_CAPSULE | ORAL | 0 refills | Status: AC

## 2022-07-09 ENCOUNTER — Other Ambulatory Visit: Payer: Self-pay | Admitting: *Deleted

## 2022-07-09 DIAGNOSIS — E559 Vitamin D deficiency, unspecified: Secondary | ICD-10-CM

## 2022-07-23 ENCOUNTER — Encounter: Payer: Self-pay | Admitting: *Deleted

## 2022-07-23 ENCOUNTER — Telehealth: Payer: Self-pay | Admitting: *Deleted

## 2022-07-23 NOTE — Patient Instructions (Signed)
Visit Information  Thank you for taking time to visit with me today. Please don't hesitate to contact me if I can be of assistance to you.   Following are the goals we discussed today:   Goals Addressed               This Visit's Progress     COMPLETED: No needs (pt-stated)        Care Coordination Interventions: Reviewed medications with patient and discussed adherence with no needed refills Reviewed scheduled/upcoming provider appointments including pending appointments Assessed social determinant of health barriers           Please call the care guide team at [phone] if you need to cancel or reschedule your appointment.   If you are experiencing a Mental Health or [place] or need someone to talk to, please call the Suicide and Crisis Lifeline: 988  Patient verbalizes understanding of instructions and care plan provided today and agrees to view in [place]. Active MyChart status and patient understanding of how to access instructions and care plan via MyChart confirmed with patient.     No further follow up required: No needs  Raina Mina, RN Care Management Coordinator [place] Office [phone]

## 2022-07-23 NOTE — Patient Outreach (Signed)
  Care Coordination   Initial Visit Note   07/23/2022 Name: Linda Winters MRN: [id number] DOB: 1976-06-21  Linda Winters is a 46 y.o. year old female who sees Isaac Bliss, Rayford Halsted, MD for primary care. I spoke with  Linda Winters by phone today.  What matters to the patients health and wellness today?  No needs    Goals Addressed               This Visit's Progress     COMPLETED: No needs (pt-stated)        Care Coordination Interventions: Reviewed medications with patient and discussed adherence with no needed refills Reviewed scheduled/upcoming provider appointments including pending appointments Assessed social determinant of health barriers          SDOH assessments and interventions completed:  Yes  SDOH Interventions Today    Flowsheet Row Most Recent Value  SDOH Interventions   Food Insecurity Interventions Intervention Not Indicated  Housing Interventions Intervention Not Indicated  Transportation Interventions Intervention Not Indicated  Utilities Interventions Intervention Not Indicated        Care Coordination Interventions Activated:  Yes  Care Coordination Interventions:  Yes, provided   Follow up plan: No further intervention required.   Encounter Outcome:  Pt. Visit Completed   Raina Mina, RN Care Management Coordinator [place] Office [phone]

## 2022-08-08 ENCOUNTER — Other Ambulatory Visit: Payer: Self-pay | Admitting: Internal Medicine

## 2022-08-08 DIAGNOSIS — Z1231 Encounter for screening mammogram for malignant neoplasm of breast: Secondary | ICD-10-CM

## 2022-08-22 ENCOUNTER — Other Ambulatory Visit: Payer: BLUE CROSS/BLUE SHIELD

## 2022-08-25 ENCOUNTER — Ambulatory Visit
Admission: RE | Admit: 2022-08-25 | Discharge: 2022-08-25 | Disposition: A | Discharge status: Home / Self Care | Payer: BLUE CROSS/BLUE SHIELD | Source: Ambulatory Visit | Attending: Internal Medicine | Admitting: Internal Medicine

## 2022-08-25 DIAGNOSIS — R221 Localized swelling, mass and lump, neck: Secondary | ICD-10-CM

## 2022-10-09 ENCOUNTER — Ambulatory Visit
Admission: RE | Admit: 2022-10-09 | Discharge: 2022-10-09 | Disposition: A | Discharge status: Home / Self Care | Payer: 59 | Source: Ambulatory Visit | Attending: Internal Medicine | Admitting: Internal Medicine

## 2022-10-09 DIAGNOSIS — Z1231 Encounter for screening mammogram for malignant neoplasm of breast: Secondary | ICD-10-CM

## 2022-10-12 ENCOUNTER — Other Ambulatory Visit: Payer: Self-pay | Admitting: Internal Medicine

## 2022-10-12 DIAGNOSIS — E559 Vitamin D deficiency, unspecified: Secondary | ICD-10-CM

## 2023-04-06 ENCOUNTER — Encounter: Payer: Self-pay | Admitting: Internal Medicine

## 2023-04-08 ENCOUNTER — Ambulatory Visit (INDEPENDENT_AMBULATORY_CARE_PROVIDER_SITE_OTHER): Payer: 59 | Admitting: Nurse Practitioner

## 2023-04-08 VITALS — BP 116/72 | HR 75 | Ht 69.0 in | Wt 217.0 lb

## 2023-04-08 DIAGNOSIS — Z01419 Encounter for gynecological examination (general) (routine) without abnormal findings: Secondary | ICD-10-CM

## 2023-04-08 DIAGNOSIS — N951 Menopausal and female climacteric states: Secondary | ICD-10-CM | POA: Diagnosis not present

## 2023-04-08 NOTE — Progress Notes (Signed)
   Linda Winters 1976/02/23 [id number]   History:  47 y.o. [id number] presents for annual exam. Complains of hot flashes and night sweats. S/P 2018 LAVH with bilateral salpingectomies for endometriosis. Normal pap and mammogram history.   Gynecologic History Patient's last menstrual period was 05/08/2017 (exact date).   Contraception/Family planning: status post hysterectomy Sexually active: Yes  Health Maintenance Last Pap: 10/25/2015. Results were: Normal Last mammogram: 10/04/2022. Results were: Normal Last colonoscopy: 2023. Results were: Benign polyps, 5-year recall Last Dexa: Not indicated  Past medical history, past surgical history, family history and social history were all reviewed and documented in the EPIC chart. Married. Production designer, theatre/television/film at [employer]. Daughter and son.   ROS:  A ROS was performed and pertinent positives and negatives are included.  Exam:  Vitals:   04/08/23 0936  BP: 116/72  Pulse: 75  SpO2: 100%  Weight: 217 lb (98.4 kg)  Height: 5\' 9"  (1.753 m)    Body mass index is 32.05 kg/m.  General appearance:  Normal Thyroid:  Symmetrical, normal in size, without palpable masses or nodularity. Respiratory  Auscultation:  Clear without wheezing or rhonchi Cardiovascular  Auscultation:  Regular rate, without rubs, murmurs or gallops  Edema/varicosities:  Not grossly evident Abdominal  Soft,nontender, without masses, guarding or rebound.  Liver/spleen:  No organomegaly noted  Hernia:  None appreciated  Skin  Inspection:  Grossly normal Breasts: Examined lying and sitting.   Right: Without masses, retractions, nipple discharge or axillary adenopathy.   Left: Without masses, retractions, nipple discharge or axillary adenopathy. Genitourinary   Inguinal/mons:  Normal without inguinal adenopathy  External genitalia:  Normal appearing vulva with no masses, tenderness, or lesions  BUS/Urethra/Skene's glands:  Normal  Vagina:  Normal appearing with normal color and  discharge, no lesions  Cervix:  Absent  Adnexa/parametria:     Rt: Normal in size, without masses or tenderness.   Lt: Normal in size, without masses or tenderness.  Anus and perineum: Normal  Digital rectal exam: Deferred  Patient informed chaperone available to be present for breast and pelvic exam. Patient has requested no chaperone to be present. Patient has been advised what will be completed during breast and pelvic exam.   Assessment/Plan:  47 y.o. [id number] for annual exam.   Well female exam with routine gynecological exam - Education provided on SBEs, importance of preventative screenings, current guidelines, high calcium diet, regular exercise, and multivitamin daily. Labs with PCP.   Vasomotor symptoms due to menopause - Hot flashes and night sweats. Management options discussed to include OTC supplements, SSRI, Veoz[place] and HRT. Would like to try OTC supplements. List provided.   Screening for cervical cancer - Normal Pap history.  No longer screening per guidelines.  Screening for breast cancer - Normal mammogram history.  Continue annual screenings.  Normal breast exam today.  Screening for colon cancer - Colonoscopy in 2023. Will repeat at 5-year interval per GI's recommendation.   Follow up in 1 year for annual.     [name] [facility], 9:54 AM 04/08/2023

## 2023-04-10 ENCOUNTER — Encounter: Payer: Self-pay | Admitting: Nurse Practitioner

## 2023-04-10 NOTE — Telephone Encounter (Signed)
TW pt seen on 04/08/2023.

## 2023-04-10 NOTE — Telephone Encounter (Signed)
Will also route to TW for documentation for future exams.

## 2023-05-04 ENCOUNTER — Other Ambulatory Visit: Payer: Self-pay

## 2023-05-04 ENCOUNTER — Emergency Department (HOSPITAL_BASED_OUTPATIENT_CLINIC_OR_DEPARTMENT_OTHER): Payer: 59

## 2023-05-04 ENCOUNTER — Emergency Department (HOSPITAL_BASED_OUTPATIENT_CLINIC_OR_DEPARTMENT_OTHER)
Admission: EM | Admit: 2023-05-04 | Discharge: 2023-05-05 | Disposition: A | Discharge status: Home / Self Care | Payer: 59 | Attending: Emergency Medicine | Admitting: Emergency Medicine

## 2023-05-04 ENCOUNTER — Encounter (HOSPITAL_BASED_OUTPATIENT_CLINIC_OR_DEPARTMENT_OTHER): Payer: Self-pay

## 2023-05-04 DIAGNOSIS — K529 Noninfective gastroenteritis and colitis, unspecified: Secondary | ICD-10-CM | POA: Insufficient documentation

## 2023-05-04 DIAGNOSIS — R1084 Generalized abdominal pain: Secondary | ICD-10-CM | POA: Diagnosis present

## 2023-05-04 DIAGNOSIS — R112 Nausea with vomiting, unspecified: Secondary | ICD-10-CM

## 2023-05-04 LAB — COMPREHENSIVE METABOLIC PANEL
ALT: 10 U/L (ref 0–44)
AST: 23 U/L (ref 15–41)
Albumin: 4.2 g/dL (ref 3.5–5.0)
Alkaline Phosphatase: 63 U/L (ref 38–126)
Anion gap: 13 (ref 5–15)
BUN: 15 mg/dL (ref 6–20)
CO2: 19 mmol/L — ABNORMAL LOW (ref 22–32)
Calcium: 9.3 mg/dL (ref 8.9–10.3)
Chloride: 105 mmol/L (ref 98–111)
Creatinine, Ser: 0.61 mg/dL (ref 0.44–1.00)
GFR, Estimated: >60 mL/min (ref >60)
Glucose, Bld: 90 mg/dL (ref 70–99)
Potassium: 3.1 mmol/L — ABNORMAL LOW (ref 3.5–5.1)
Sodium: 137 mmol/L (ref 135–145)
Total Bilirubin: 0.6 mg/dL (ref 0.3–1.2)
Total Protein: 7.4 g/dL (ref 6.5–8.1)

## 2023-05-04 LAB — URINALYSIS, ROUTINE W REFLEX MICROSCOPIC
Bilirubin Urine: NEGATIVE
Glucose, UA: NEGATIVE mg/dL
Ketones, ur: NEGATIVE mg/dL
Leukocytes,Ua: NEGATIVE
Nitrite: NEGATIVE
Protein, ur: 100 mg/dL — AB
Specific Gravity, Urine: >=1.03 (ref 1.005–1.030)
pH: 5.5 (ref 5.0–8.0)

## 2023-05-04 LAB — CBC
HCT: 41.2 % (ref 36.0–46.0)
Hemoglobin: 14.4 g/dL (ref 12.0–15.0)
MCH: 29.3 pg (ref 26.0–34.0)
MCHC: 35 g/dL (ref 30.0–36.0)
MCV: 83.7 fL (ref 80.0–100.0)
Platelets: 354 10*3/uL (ref 150–400)
RBC: 4.92 MIL/uL (ref 3.87–5.11)
RDW: 12.5 % (ref 11.5–15.5)
WBC: 19.8 10*3/uL — ABNORMAL HIGH (ref 4.0–10.5)
nRBC: 0 % (ref 0.0–0.2)

## 2023-05-04 LAB — URINALYSIS, MICROSCOPIC (REFLEX)

## 2023-05-04 LAB — LIPASE, BLOOD: Lipase: 23 U/L (ref 11–51)

## 2023-05-04 MED ORDER — HYDROMORPHONE HCL 1 MG/ML IJ SOLN
1.0000 mg | Freq: Once | INTRAMUSCULAR | Status: DC
  Filled 2023-05-04: qty 1

## 2023-05-04 MED ORDER — IOHEXOL 300 MG/ML  SOLN
100.0000 mL | Freq: Once | INTRAMUSCULAR | Status: AC | PRN
  Administered 2023-05-04: 100 mL via INTRAVENOUS

## 2023-05-04 MED ORDER — ONDANSETRON 4 MG PO TBDP: 4.0000 mg | ORAL_TABLET | ORAL | 0 refills | Status: AC | PRN

## 2023-05-04 MED ORDER — ONDANSETRON HCL 4 MG/2ML IJ SOLN
4.0000 mg | Freq: Once | INTRAMUSCULAR | Status: AC
  Administered 2023-05-04: 4 mg via INTRAVENOUS
  Filled 2023-05-04: qty 2

## 2023-05-04 MED ORDER — METRONIDAZOLE 500 MG PO TABS: 500.0000 mg | ORAL_TABLET | Freq: Two times a day (BID) | ORAL | 0 refills | Status: DC

## 2023-05-04 MED ORDER — CIPROFLOXACIN HCL 500 MG PO TABS: 500.0000 mg | ORAL_TABLET | Freq: Two times a day (BID) | ORAL | 0 refills | Status: DC

## 2023-05-04 MED ORDER — HYDROCODONE-ACETAMINOPHEN 5-325 MG PO TABS: 1.0000 | ORAL_TABLET | Freq: Four times a day (QID) | ORAL | 0 refills | Status: AC | PRN

## 2023-05-04 NOTE — Discharge Instructions (Signed)
1.  At this time there is some inflammation around your small bowel and cecum area.  Often this is due to a virus that might cause vomiting and diarrhea however it may also be due to bacteria.  You do have an elevated white blood cell count.  You had been prescribed ciprofloxacin and Flagyl.  Start these medications tonight. 2.  You have been prescribed Vicodin to take for pain.  You may take 1 to 2 tablets as needed.  However if you are having severe and worsening pain, return to the emergency department immediately for recheck. 3.  You have been prescribed Zofran to take for nausea.  Take this as needed every 4 hours. 4.  For the next 12 hours, just take liquids and then increase your diet to very bland soft foods over the next 24 hours. 5.  Return to emergency department immediately for suddenly worsening symptoms, fever or other concerning changes.  See your doctor for recheck within the next 24 to 48 hours.

## 2023-05-04 NOTE — ED Triage Notes (Addendum)
Pt picked up from work with several days of back pain, and abd pain that began today after eating a [place] sandwich. Pt reports that this is her usual lunch. Pt hx of hysterectomy and had colonoscopy this year. Pt vomiting at work. Given 12.5mg  phenergan and 100 mcg fentanyl PTA. Pt tearful in triage; still in pain. No urinary sx. CBG 121 with EMS

## 2023-05-04 NOTE — ED Provider Notes (Signed)
[place] [facility] AT [facility] Provider Note   CSN: [id number] Arrival date & time: 05/04/23  1939     History  Chief Complaint  Patient presents with   Abdominal Pain    Linda Winters is a 47 y.o. female.  HPI Patient reports she started getting lower back pain and lower abdominal discomfort a couple days ago.  She reports today after eating a [place] sandwich symptoms got much worse and she got suddenly very nauseated and intense sharp pain in her central upper abdomen.  It caused her to vomit a lot and then have diarrheal bowel movement.  Patient reports since then she has continued to have significant amount of abdominal pain achy in nature with low back pain.  Patient has had a hysterectomy.  She still has gallbladder and appendix present.    Home Medications Prior to Admission medications   Medication Sig Start Date End Date Taking? Authorizing Provider  ciprofloxacin (CIPRO) 500 MG tablet Take 1 tablet (500 mg total) by mouth 2 (two) times daily. One po bid x 7 days 05/04/23  Yes Isham Smitherman, Lebron Conners, MD  HYDROcodone-acetaminophen (NORCO/VICODIN) 5-325 MG tablet Take 1-2 tablets by mouth every 6 (six) hours as needed. 05/04/23  Yes Arby Barrette, MD  metroNIDAZOLE (FLAGYL) 500 MG tablet Take 1 tablet (500 mg total) by mouth 2 (two) times daily. One po bid x 7 days 05/04/23  Yes Brittian Renaldo, Lebron Conners, MD  ondansetron (ZOFRAN-ODT) 4 MG disintegrating tablet Take 1 tablet (4 mg total) by mouth every 4 (four) hours as needed for nausea or vomiting. 05/04/23  Yes Ofilia Rayon, Lebron Conners, MD  cyanocobalamin (VITAMIN B12) 500 MCG tablet Take 500 mcg by mouth daily.    [provider]  ibuprofen (ADVIL,MOTRIN) 600 MG tablet Take 600 mg by mouth 4 (four) times daily as needed for cramping.    [provider]  Probiotic Product (ALIGN) 4 MG CAPS Take by mouth.    [provider]  VITAMIN D PO Take by mouth.    [provider]      Allergies     Compazine [prochlorperazine edisylate]    Review of Systems   Review of Systems  Physical Exam Updated Vital Signs BP 114/85   Pulse 72   Temp 98.4 F (36.9 C) (Oral)   Resp 15   Ht 5\' 9"  (1.753 m)   Wt 102.1 kg   LMP 05/08/2017 (Exact Date) Comment: GYN - [place]  SpO2 99%   BMI 33.23 kg/m  Physical Exam Constitutional:      Comments: Alert, nontoxic, no respiratory distress, well nourished well-developed.  Eyes:     Extraocular Movements: Extraocular movements intact.     Conjunctiva/sclera: Conjunctivae normal.  Cardiovascular:     Rate and Rhythm: Normal rate and regular rhythm.  Pulmonary:     Effort: Pulmonary effort is normal.     Breath sounds: Normal breath sounds.  Abdominal:     Comments: Abdomen is diffusely tender.  Significant tenderness in the central and epigastric area.  Musculoskeletal:        General: No swelling or tenderness. Normal range of motion.     Right lower leg: No edema.     Left lower leg: No edema.  Skin:    General: Skin is warm and dry.  Neurological:     General: No focal deficit present.     Mental Status: She is oriented to person, place, and time.     Coordination: Coordination normal.  Psychiatric:        Mood and Affect: Mood normal.     ED Results / Procedures / Treatments   Labs (all labs ordered are listed, but only abnormal results are displayed) Labs Reviewed  COMPREHENSIVE METABOLIC PANEL - Abnormal; Notable for the following components:      Result Value   Potassium 3.1 (*)    CO2 19 (*)    All other components within normal limits  CBC - Abnormal; Notable for the following components:   WBC 19.8 (*)    All other components within normal limits  URINALYSIS, ROUTINE W REFLEX MICROSCOPIC - Abnormal; Notable for the following components:   APPearance HAZY (*)    Hgb urine dipstick MODERATE (*)    Protein, ur 100 (*)    All other components within normal limits  URINALYSIS, MICROSCOPIC (REFLEX) -  Abnormal; Notable for the following components:   Bacteria, UA RARE (*)    All other components within normal limits  LIPASE, BLOOD    EKG None  Radiology CT ABDOMEN PELVIS W CONTRAST  Result Date: 05/04/2023 CLINICAL DATA:  Generalized abdominal pain and left lower back pain for 2 days. Nausea and vomiting. EXAM: CT ABDOMEN AND PELVIS WITH CONTRAST TECHNIQUE: Multidetector CT imaging of the abdomen and pelvis was performed using the standard protocol following bolus administration of intravenous contrast. RADIATION DOSE REDUCTION: This exam was performed according to the departmental dose-optimization program which includes automated exposure control, adjustment of the mA and/or kV according to patient size and/or use of iterative reconstruction technique. CONTRAST:  OMNIPAQUE IOHEXOL 300 MG/ML  SOLN COMPARISON:  None Available. FINDINGS: Lower chest: No acute abnormality. Hepatobiliary: No focal liver abnormality is seen. Fatty infiltration of the liver is noted. No gallstones, gallbladder wall thickening, or biliary dilatation. Pancreas: Unremarkable. No pancreatic ductal dilatation or surrounding inflammatory changes. Spleen: Normal in size without focal abnormality. Adrenals/Urinary Tract: The adrenal glands are within normal limits. The kidneys enhance symmetrically. No renal calculus or hydronephrosis. Bladder is decompressed. Stomach/Bowel: The stomach is within normal limits. No bowel obstruction, free air, or pneumatosis. The appendix appears normal. Scattered diverticula are present along the colon without evidence of diverticulitis. No focal bowel wall thickening is seen. Mesenteric fat stranding is seen about the small-bowel and cecum, suggesting enteritis. Vascular/Lymphatic: No significant vascular findings are present. No enlarged abdominal or pelvic lymph nodes. Reproductive: Status post hysterectomy. No adnexal masses. Other: A small amount of free fluid is noted in the pelvis.  Musculoskeletal: Disc herniations are noted at L2-L3 and L3-L4 calcification of the disc annulus. No acute osseous abnormality. IMPRESSION: 1. Diffuse fat stranding about the small-bowel and cecum suggesting enteritis. No evidence of bowel obstruction or free air. 2. Small amount of free fluid in the pelvis. 3. Diverticulosis without diverticulitis. 4. Hepatic steatosis. Electronically Signed   By: Thornell Sartorius M.D.   On: 05/04/2023 22:28    Procedures Procedures    Medications Ordered in ED Medications  HYDROmorphone (DILAUDID) injection 1 mg (0 mg Intravenous Hold 05/04/23 2221)  ondansetron (ZOFRAN) injection 4 mg (4 mg Intravenous Given 05/04/23 2145)  iohexol (OMNIPAQUE) 300 MG/ML solution 100 mL (100 mLs Intravenous Contrast Given 05/04/23 2151)    ED Course/ Medical Decision Making/ A&P                                 Medical Decision Making Amount and/or Complexity of Data Reviewed Labs: ordered.  Radiology: ordered.  Risk Prescription drug management.   Patient has been having some abdominal discomfort and low back pain for several days.  Today it got suddenly much worse after eating.  She then went on to have vomiting and diarrhea.  Examination patient has significant reproducible abdominal tenderness in the mid and upper abdomen.  Will proceed with CT scan and lab work.  White count 19,000, H&H normal.  Routine chemistry is normal except for testing with 3.1.  Urinalysis 6-10 white cells rare bacteria.  CT scan interpreted by radiology positive for stranding inflammatory change around the cecum and small bowel.  Patient reassessed.  She reports she feels much better.  She is well in appearance and has normal vital signs.  At this time differential diagnosis includes viral versus bacterial enteritis.  No signs of perforation but patient does have significantly elevated white count.  Will opt to treat with the Cipro and Flagyl.  Patient prescribed Vicodin and Zofran for  symptomatic treatment.  We have reviewed strict return precautions for immediate return if there is sudden increase in pain, fever or other concerning changes.  Patient voices understanding.        Final Clinical Impression(s) / ED Diagnoses Final diagnoses:  Enteritis  Generalized abdominal pain  Nausea vomiting and diarrhea    Rx / DC Orders ED Discharge Orders          Ordered    ciprofloxacin (CIPRO) 500 MG tablet  2 times daily        05/04/23 2340    metroNIDAZOLE (FLAGYL) 500 MG tablet  2 times daily        05/04/23 2340    HYDROcodone-acetaminophen (NORCO/VICODIN) 5-325 MG tablet  Every 6 hours PRN        05/04/23 2340    ondansetron (ZOFRAN-ODT) 4 MG disintegrating tablet  Every 4 hours PRN        05/04/23 2340              Arby Barrette, MD 05/04/23 2356

## 2023-05-07 ENCOUNTER — Encounter: Payer: Self-pay | Admitting: Family Medicine

## 2023-05-07 ENCOUNTER — Ambulatory Visit: Payer: 59 | Admitting: Family Medicine

## 2023-05-07 VITALS — BP 142/80 | HR 108 | Temp 98.9°F | Ht 69.0 in | Wt 213.8 lb

## 2023-05-07 DIAGNOSIS — K529 Noninfective gastroenteritis and colitis, unspecified: Secondary | ICD-10-CM | POA: Diagnosis not present

## 2023-05-07 DIAGNOSIS — K760 Fatty (change of) liver, not elsewhere classified: Secondary | ICD-10-CM

## 2023-05-07 DIAGNOSIS — M5126 Other intervertebral disc displacement, lumbar region: Secondary | ICD-10-CM

## 2023-05-07 DIAGNOSIS — M5442 Lumbago with sciatica, left side: Secondary | ICD-10-CM

## 2023-05-07 NOTE — Patient Instructions (Signed)
A referral was placed for you to see physical therapy.  They will contact you about setting up the appointment.

## 2023-05-07 NOTE — Progress Notes (Signed)
Established Patient Office Visit   Subjective  Patient ID: Linda Winters, female    DOB: [birth year]  Age: 47 y.o. MRN: [id number]  Chief Complaint  Patient presents with   Back Pain    Patient came in today for 6 month back pain, patient was also seen at the ED on Monday for stomach pain, nausea, vomiting and diarrhea    Pt is a 47 year old female followed by Dr. Ardyth Harps and seen for acute and ongoing concerns.  Patient seen in ED on 05/04/2023 for abdominal pain, nausea, vomiting, diarrhea.  Patient states symptoms started that day while at work.  In ED CT abdomen pelvis with enteritis.  Patient sent home with Flagyl and Cipro given concern for diverticulitis as patient had diverticula noted on colonoscopy on 02/24/2022.  Patient inquires if she needs to continue antibiotics.  States no longer having nausea and vomiting, but stomach feels full "like there is acid in it."  Patient denies diarrhea.  Had a hard stool yesterday.  Eating toast, applesauce despite not having much of an appetite.  Patient denies fever, chills.  Patient did not take Norco prescribed from ED. states she does not like taking medications.  Patient also notes intermittent back pain.  Patient endorses 2 episodes of the last 6 months after running on the treadmill.  Patient notes bilateral low back pain with occasional tingling into left LE.  Patient notes uncomfortable sitting then trying to stand.  Patient works in Event organiser.  No loss of bowel or bladder.  CT scan in ED mention herniated disc but did not comment on degree of herniation.  Patient taking Tylenol for symptoms.  Using heat.    Patient Active Problem List   Diagnosis Date Noted   Obesity (BMI 30.0-34.9) 04/05/2019   Hyperlipidemia 04/05/2019   Vitamin D deficiency 04/05/2019   Vitamin B12 deficiency 04/05/2019   Past Surgical History:  Procedure Laterality Date   CYSTOSCOPY N/A 05/26/2017   Procedure: CYSTOSCOPY;  Surgeon: Dara Lords, MD;  Location: [facility];  Service: Gynecology;  Laterality: N/A;   LAPAROSCOPIC VAGINAL HYSTERECTOMY WITH SALPINGECTOMY Bilateral 05/26/2017   Procedure: LAPAROSCOPIC ASSISTED VAGINAL HYSTERECTOMY WITH SALPINGECTOMY;  Surgeon: Dara Lords, MD;  Location: [facility];  Service: Gynecology;  Laterality: Bilateral;  requests 7:30am OR time  requests 2 1/2 hours   PELVIC LAPAROSCOPY     WISDOM TOOTH EXTRACTION     Social History   Tobacco Use   Smoking status: Former    Current packs/day: 0.00    Types: Cigarettes    Quit date: 02/06/2011    Years since quitting: 12.2   Smokeless tobacco: Never  Vaping Use   Vaping status: Never Used  Substance Use Topics   Alcohol use: Yes    Alcohol/week: 0.0 standard drinks of alcohol    Comment: Social   Drug use: No   Family History  Problem Relation Age of Onset   Hypertension Mother    Diabetes Father    Hypertension Father    Heart disease Father    Colon polyps Sister    Esophageal cancer Maternal Uncle    Cancer Maternal Uncle        throat   Cancer Maternal Grandfather        prostate   Breast cancer Neg Hx    Colon cancer Neg Hx    Stomach cancer Neg Hx    Rectal cancer Neg Hx    Allergies  Allergen Reactions  Compazine [Prochlorperazine Edisylate] Anxiety      ROS Negative unless stated above    Objective:     BP (!) 142/80 (BP Location: Right Arm, Patient Position: Sitting, Cuff Size: Normal) Comment: patient is in pain Rate 6/10  Pulse (!) 108 Comment: patient is in pain 6/10  Temp 98.9 F (37.2 C) (Oral)   Ht 5\' 9"  (1.753 m)   Wt 213 lb 12.8 oz (97 kg)   LMP 05/08/2017 (Exact Date) Comment: GYN - [facility]  SpO2 97%   BMI 31.57 kg/m  BP Readings from Last 3 Encounters:  05/07/23 (!) 142/80  05/04/23 114/85  04/08/23 116/72   Wt Readings from Last 3 Encounters:  05/07/23 213 lb 12.8 oz (97 kg)  05/04/23 225 lb (102.1 kg)  04/08/23 217 lb (98.4 kg)      Physical  Exam Constitutional:      General: She is not in acute distress.    Appearance: Normal appearance.  HENT:     Head: Normocephalic and atraumatic.     Nose: Nose normal.     Mouth/Throat:     Mouth: Mucous membranes are moist.  Cardiovascular:     Rate and Rhythm: Normal rate and regular rhythm.     Heart sounds: Normal heart sounds. No murmur heard.    No gallop.  Pulmonary:     Effort: Pulmonary effort is normal. No respiratory distress.     Breath sounds: Normal breath sounds. No wheezing, rhonchi or rales.  Musculoskeletal:     Comments: No cervical, thoracic TTP midline.  TTP of lower lumbar spine and paraspinal muscles bilaterally.  TTP of left sciatic nerve and bilateral hips.  Skin:    General: Skin is warm and dry.  Neurological:     Mental Status: She is alert and oriented to person, place, and time.      No results found for any visits on 05/07/23.    Assessment & Plan:  Enteritis  Acute bilateral low back pain with left-sided sciatica -     Ambulatory referral to Physical Therapy  Lumbar disc herniation -     Ambulatory referral to Physical Therapy  Hepatic steatosis  Acute enteritis as noted on CT abdomen pelvis in ED on 05/04/2023.  Hepatic steatosis also noted on imaging.  Lifestyle modifications.  Leukocytosis 19.8 also noted in ED.  Though no overt signs of diverticulitis noted on imaging patient advised to continue Flagyl and Cipro given history of diverticulosis and current symptoms.  Continue bland diet, advance as tolerated.  Given precautions.  Acute back pain with sciatica possibly 2/2 herniated disc as briefly noted on CT abdomen pelvis from 05/04/2023.  Herniations noted at L2-3 and L3-4 with calcification of the disc annulus.  Patient vies continue supportive care including heat, stretching, ice, topical analgesics, lidocaine patches, Tylenol or NSAIDs as needed.  Given current symptoms and imaging discussed PT.  Referral placed.  Return if symptoms  worsen or fail to improve.   Deeann Saint, MD

## 2023-05-13 ENCOUNTER — Encounter: Payer: Self-pay | Admitting: Nurse Practitioner

## 2023-05-13 NOTE — Progress Notes (Unsigned)
    Aleen Sells D.[facility] [address] [place] [address] Phone: [phone]   Assessment and Plan:     There are no diagnoses linked to this encounter.  ***   Pertinent previous records reviewed include ***   Follow Up: ***     Subjective:   I, Linda Winters, am serving as a scribe for Dr. Richardean Sale  Chief Complaint: low back pain   HPI:   05/13/23 Patient is a 47 year old female presenting with low back pain that has been going on for a couple of months. Patient states this started after running on a treadmill. Patient does note some LE tingling and pain is worse when sitting and then trying to stand. Patient works in Engineering geologist so on concrete floors. Patient had a CT scan done at the ED. Patient was seen by PCP on 05/07/23 and referred to physical therapy. Today patient reports  Relevant Historical Information: ***  Additional pertinent review of systems negative.   Current Outpatient Medications:    ciprofloxacin (CIPRO) 500 MG tablet, Take 1 tablet (500 mg total) by mouth 2 (two) times daily. One po bid x 7 days, Disp: 14 tablet, Rfl: 0   cyanocobalamin (VITAMIN B12) 500 MCG tablet, Take 500 mcg by mouth daily., Disp: , Rfl:    HYDROcodone-acetaminophen (NORCO/VICODIN) 5-325 MG tablet, Take 1-2 tablets by mouth every 6 (six) hours as needed., Disp: 20 tablet, Rfl: 0   ibuprofen (ADVIL,MOTRIN) 600 MG tablet, Take 600 mg by mouth 4 (four) times daily as needed for cramping., Disp: , Rfl:    metroNIDAZOLE (FLAGYL) 500 MG tablet, Take 1 tablet (500 mg total) by mouth 2 (two) times daily. One po bid x 7 days, Disp: 14 tablet, Rfl: 0   ondansetron (ZOFRAN-ODT) 4 MG disintegrating tablet, Take 1 tablet (4 mg total) by mouth every 4 (four) hours as needed for nausea or vomiting., Disp: 20 tablet, Rfl: 0   Probiotic Product (ALIGN) 4 MG CAPS, Take by mouth., Disp: , Rfl:    VITAMIN D PO, Take by mouth., Disp: , Rfl:    Objective:      There were no vitals filed for this visit.    There is no height or weight on file to calculate BMI.    Physical Exam:    ***   Electronically signed by:  Aleen Sells [facility] 4:24 PM 05/13/23

## 2023-05-14 ENCOUNTER — Ambulatory Visit: Payer: 59 | Admitting: Sports Medicine

## 2023-05-14 ENCOUNTER — Other Ambulatory Visit: Payer: Self-pay | Admitting: Nurse Practitioner

## 2023-05-14 VITALS — BP 110/70 | HR 74 | Ht 69.0 in | Wt 215.0 lb

## 2023-05-14 DIAGNOSIS — G8929 Other chronic pain: Secondary | ICD-10-CM | POA: Diagnosis not present

## 2023-05-14 DIAGNOSIS — M5442 Lumbago with sciatica, left side: Secondary | ICD-10-CM | POA: Diagnosis not present

## 2023-05-14 DIAGNOSIS — B3731 Acute candidiasis of vulva and vagina: Secondary | ICD-10-CM

## 2023-05-14 DIAGNOSIS — M5126 Other intervertebral disc displacement, lumbar region: Secondary | ICD-10-CM

## 2023-05-14 MED ORDER — FLUCONAZOLE 150 MG PO TABS: 150.0000 mg | ORAL_TABLET | ORAL | 0 refills | Status: DC

## 2023-05-14 MED ORDER — MELOXICAM 15 MG PO TABS: 15.0000 mg | ORAL_TABLET | Freq: Every day | ORAL | 0 refills | Status: DC

## 2023-05-14 MED ORDER — CYCLOBENZAPRINE HCL 5 MG PO TABS: 5.0000 mg | ORAL_TABLET | Freq: Three times a day (TID) | ORAL | 0 refills | Status: AC | PRN

## 2023-05-14 NOTE — Patient Instructions (Addendum)
Good to see you - Start meloxicam 15 mg daily x2 weeks.  If still having pain after 2 weeks, complete 3rd-week of meloxicam. May use remaining meloxicam as needed once daily for pain control.  Do not to use additional NSAIDs while taking meloxicam.  May use Tylenol [phone] mg 2 to 3 times a day for breakthrough pain. Start flexeril 5-10mg  nightly as needed for muscle spasms Low back exercises given  Start PT focusing on low back and core Can do Stationary bike, elliptical, or water exercises if back is flared Follow up in 4 weeks

## 2023-05-20 NOTE — Therapy (Signed)
OUTPATIENT PHYSICAL THERAPY THORACOLUMBAR EVALUATION   Patient Name: Linda Winters MRN: [id number] DOB:1975/11/27, 47 y.o., female Today's Date: 05/27/2023  END OF SESSION:  PT End of Session - 05/27/23 0804     Visit Number 1    Date for PT Re-Evaluation 07/22/23    Authorization Type [employer] - VL: 20    PT Start Time 0804    PT Stop Time 0850    PT Time Calculation (min) 46 min    Activity Tolerance Patient tolerated treatment well    Behavior During Therapy WFL for tasks assessed/performed             Past Medical History:  Diagnosis Date   Anxiety    Depression    Endometriosis    Hemoglobin C trait (HCC)    Hyperlipidemia    NSVD (normal spontaneous vaginal delivery)    X 2   Past Surgical History:  Procedure Laterality Date   CYSTOSCOPY N/A 05/26/2017   Procedure: CYSTOSCOPY;  Surgeon: Dara Lords, MD;  Location: [facility];  Service: Gynecology;  Laterality: N/A;   LAPAROSCOPIC VAGINAL HYSTERECTOMY WITH SALPINGECTOMY Bilateral 05/26/2017   Procedure: LAPAROSCOPIC ASSISTED VAGINAL HYSTERECTOMY WITH SALPINGECTOMY;  Surgeon: Dara Lords, MD;  Location: [facility];  Service: Gynecology;  Laterality: Bilateral;  requests 7:30am OR time  requests 2 1/2 hours   PELVIC LAPAROSCOPY     WISDOM TOOTH EXTRACTION     Patient Active Problem List   Diagnosis Date Noted   Obesity (BMI 30.0-34.9) 04/05/2019   Hyperlipidemia 04/05/2019   Vitamin D deficiency 04/05/2019   Vitamin B12 deficiency 04/05/2019    PCP: Philip Aspen, Limmie Patricia, MD   REFERRING PROVIDER: Deeann Saint, MD   REFERRING DIAG:  [phone] (ICD-10-CM) - Acute bilateral low back pain with left-sided sciatica  M51.26 (ICD-10-CM) - Lumbar disc herniation   THERAPY DIAG:  Acute bilateral low back pain with left-sided sciatica  Muscle weakness (generalized)  Muscle spasm of back  Other abnormalities of gait and mobility  RATIONALE FOR EVALUATION AND TREATMENT: Rehabilitation  ONSET DATE:  6-8 months  NEXT MD VISIT: 06/11/23 with Richardean Sale, MD   SUBJECTIVE:                                                                                                                                                                                                         SUBJECTIVE STATEMENT: Pt reports 6-8 month h/o episodes of her "back going out". When she is in an episode, she notes the most pain during sit to stand transitions and  for the first few steps while walking. She also notes difficulty with prolonged sitting, esp on firm seat. Wants to work on strengthening her core to minimize recurrence. Has been prescribed muscle relaxants but has not yet need to use them - mostly gets relief from rest and heating pad.  PAIN: Are you having pain? Yes: NPRS scale: 2/10, during an "episode" up to 8-9/10 Pain location: L low back; extending in posterior L thigh and calf during an episode Pain description: soreness; sharp pinching pain during transitions while experiencing an episodes Aggravating factors: running on TM, cleaning (esp in fwd flexed position), prolonged sitting on firm surface Relieving factors: heating pad, rest (laying in bed)  PERTINENT HISTORY:  Anxiety, depression, endometriosis, obesity  PRECAUTIONS: None  RED FLAGS: None  WEIGHT BEARING RESTRICTIONS: No  FALLS:  Has patient fallen in last 6 months? 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 To learn exercises I can do on my to strengthen my back to help manage when I am in an episode."   OBJECTIVE: (objective measures completed at initial evaluation unless otherwise dated)  DIAGNOSTIC  FINDINGS:  05/04/23 - CT abdomen and pelvis: Musculoskeletal: Disc herniations are noted at L2-L3 and L3-L4 calcification of the disc annulus. No acute osseous abnormality.  PATIENT SURVEYS:  Modified Oswestry 13 / 50 = 26.0 %  FOTO Lumbar: 63, predicted 77  SCREENING FOR RED FLAGS: Bowel or bladder incontinence: No Spinal tumors: No Cauda equina syndrome: No Compression fracture: No Abdominal aneurysm: No  COGNITION:  Overall cognitive status: Within functional limits for tasks assessed    SENSATION: WFL Intermittent numbness and tingling in L posterior thigh and calf during an episode  MUSCLE LENGTH: Hamstrings: Mild tight B ITB: WFL Piriformis: Mild tight B Hip flexors: WFL Quads: WFL Heelcord: WFL  POSTURE:  Mild rounded shoulders, forward head, and increased thoracic kyphosis  PALPATION: Increased muscle tension and TTP in left lumbar paraspinals and upper glutes.  LUMBAR ROM:   Active  Eval  Flexion WFL  Extension WFL  Right lateral flexion Hand to lateral knee ^  Left lateral flexion Hand to lateral knee  Right rotation WFL  Left rotation WFL  (Blank rows = not tested, ^ = pinching pain)  LOWER EXTREMITY ROM:    B LE ROM grossly WFL   LOWER EXTREMITY MMT:    MMT Right eval Left eval  Hip flexion 4 4  Hip extension 4- 4-  Hip abduction 4 4-  Hip adduction 4 4-  Hip internal rotation 4 4  Hip external rotation 4- 4-  Knee flexion 4 4  Knee extension 4+ 4+  Ankle dorsiflexion 4 4  Ankle plantarflexion 4+  15 SLS HR 4+  14 SLS HR  Ankle inversion    Ankle eversion     (Blank rows = not tested)  LUMBAR SPECIAL TESTS:  Straight leg raise test: Negative   TODAY'S TREATMENT:   05/27/23 - Eval THERAPEUTIC EXERCISE: to improve flexibility, strength and mobility.  Demonstration, verbal and tactile cues throughout for technique.  Instruction in initial HEP to be utilized to help decrease pain and radicular symptoms during a  flare-up/episode.  THERAPEUTIC ACTIVITIES: Provided education in  neutral spine positioning in sitting during work meetings to facilitate normal lumbar lordosis   PATIENT EDUCATION:  Education details: PT eval findings, anticipated POC, initial HEP, and concept of centralizationn of pain and radicular symptoms Person educated: Patient Education method: Explanation, Demonstration, Verbal cues, and MedBridgeGO access code texted to patient Education comprehension: verbalized understanding and needs further education  HOME EXERCISE PROGRAM: *Access Code: [email or web address] URL: [email or web address] Date: 05/27/2023 Prepared by: Glenetta Hew  Exercises - Static Prone on Elbows  - 2 x daily - 7 x weekly - 2 sets - 3 reps - 30 sec hold - Prone Press Up  - 2 x daily - 7 x weekly - 2 sets - 10 reps - 2 sec hold - Standing Lumbar Extension at Wall - Forearms  - 2 x daily - 7 x weekly - 2 sets - 10 reps - 3 sec hold - Standing Lumbar Extension with Counter  - 2 x daily - 7 x weekly - 2 sets - 10 reps - 3 sec hold  *Pt using MedBridgeGO app  ASSESSMENT:  CLINICAL IMPRESSION: ASHLEYMARIE DINSMORE is a 47 y.o. female who was seen today for physical therapy evaluation and treatment for acute/chronic B low back pain with L sciatica.  She reports onset of pain ~6-8 months ago with intermittent episodes where pain becomes very severe during transitional movements and initially while walking, although pain only mild today.  Current deficits B LBP with include intermittent L LE radiculopathy, mildly limited and painful lumbar ROM, mildly limited proximal LE flexibility, abnormal muscle tension in L lumbar paraspinals and upper glutes, and L>R B LE weakness.  Pain has been severe enough that it has caused her to call out of work on occasion. Education provided on proper neutral spine positioning/alignment especially while sitting for long meetings at work, as well as Scientist, clinical (histocompatibility and immunogenetics) based extension principle to help  reduce and centralize pain and radicular symptoms is she were to experience another flare-up episode.  Christena will benefit from skilled PT to address above deficits to improve mobility and activity tolerance with decreased pain interference.   OBJECTIVE IMPAIRMENTS: decreased activity tolerance, decreased endurance, decreased knowledge of condition, decreased mobility, difficulty walking, decreased ROM, decreased strength, hypomobility, increased fascial restrictions, impaired perceived functional ability, increased muscle spasms, impaired flexibility, impaired sensation, improper body mechanics, postural dysfunction, and pain.   ACTIVITY LIMITATIONS: carrying, lifting, bending, sitting, standing, squatting, sleeping, transfers, bathing, dressing, hygiene/grooming, locomotion level, and caring for others  PARTICIPATION LIMITATIONS: meal prep, cleaning, laundry, driving, shopping, community activity, and occupation  PERSONAL FACTORS: Fitness, Past/current experiences, Time since onset of injury/illness/exacerbation, and 3+ comorbidities: Anxiety, depression, endometriosis, obesity  are also affecting patient's functional outcome.   REHAB POTENTIAL: Good  CLINICAL DECISION MAKING: Stable/uncomplicated  EVALUATION COMPLEXITY: Low   GOALS: Goals reviewed with patient? Yes  SHORT TERM GOALS: Target date: 06/24/2023  Patient will be independent with initial HEP to improve outcomes and carryover.  Baseline:  Goal status: INITIAL  2.  Patient will report centralization of radicular symptoms.  Baseline: intermittent sharp pinching pain with numbness and tingling into L calf Goal status: INITIAL  LONG TERM GOALS: Target date: 07/22/2023  Patient will be independent with ongoing/advanced HEP for self-management at home.  Baseline:  Goal status: INITIAL  2.  Patient will report 50-75% improvement in low back pain to improve QOL.  Baseline: pain up to 8-9/10 during flare-up episode Goal  status: INITIAL  3.  Patient to demonstrate ability to achieve and maintain good spinal  alignment/posturing and body mechanics needed for daily activities. Baseline:  Goal status: INITIAL  4.  Patient will demonstrate full pain free lumbar ROM to perform ADLs.   Baseline: Refer to above lumbar ROM table Goal status: INITIAL  5.  Patient will demonstrate improved B LE strength to >/= 4+/5 for improved stability and ease of mobility . Baseline: Refer to LE MMT table Goal status: INITIAL  6.  Patient will report >/= 77 on lumbar FOTO to demonstrate improved functional ability.  Baseline: 63 Goal status: INITIAL   7. Patient will report </= 14% on Modified Oswestry to demonstrate improved functional ability with decreased pain interference. Baseline: 13 / 50 = 26.0 % Goal status: INITIAL  8.  Patient will ability to complete sit to stand and walk w/o increased LBP. Baseline: Pain typically most severe during an episode with STS transition and initiation of walking Goal status: INITIAL   PLAN:  PT FREQUENCY: 1-2x/week -  pt wishing to try 1x/wk frequency to start  PT DURATION: 8 weeks  PLANNED INTERVENTIONS: Therapeutic exercises, Therapeutic activity, Neuromuscular re-education, Balance training, Gait training, Patient/Family education, Self Care, Joint mobilization, Aquatic Therapy, Dry Needling, Electrical stimulation, Spinal manipulation, Spinal mobilization, Cryotherapy, Moist heat, Taping, Traction, Ultrasound, Ionotophoresis 4mg /ml Dexamethasone, Manual therapy, and Re-evaluation  PLAN FOR NEXT SESSION: Review posture and body mechanics; review initial HEP and progress lumbopelvic flexibility and strengthening - regular HEP updates due to 1x/wk frequency; MT +/- DN to address abnormal muscle tension in L lumbar paraspinals and upper glutes   Marry Guan, PT 05/27/2023, 2:13 PM

## 2023-05-27 ENCOUNTER — Encounter: Payer: Self-pay | Admitting: Physical Therapy

## 2023-05-27 ENCOUNTER — Ambulatory Visit: Payer: 59 | Attending: Family Medicine | Admitting: Physical Therapy

## 2023-05-27 ENCOUNTER — Other Ambulatory Visit: Payer: Self-pay

## 2023-05-27 DIAGNOSIS — M5442 Lumbago with sciatica, left side: Secondary | ICD-10-CM

## 2023-05-27 DIAGNOSIS — M62830 Muscle spasm of back: Secondary | ICD-10-CM

## 2023-05-27 DIAGNOSIS — R2689 Other abnormalities of gait and mobility: Secondary | ICD-10-CM | POA: Diagnosis present

## 2023-05-27 DIAGNOSIS — M6281 Muscle weakness (generalized): Secondary | ICD-10-CM

## 2023-05-27 DIAGNOSIS — M5126 Other intervertebral disc displacement, lumbar region: Secondary | ICD-10-CM | POA: Diagnosis not present

## 2023-06-03 ENCOUNTER — Encounter: Payer: Self-pay | Admitting: Physical Therapy

## 2023-06-03 ENCOUNTER — Ambulatory Visit: Payer: 59 | Admitting: Physical Therapy

## 2023-06-03 DIAGNOSIS — M6281 Muscle weakness (generalized): Secondary | ICD-10-CM

## 2023-06-03 DIAGNOSIS — M5442 Lumbago with sciatica, left side: Secondary | ICD-10-CM | POA: Diagnosis not present

## 2023-06-03 DIAGNOSIS — M62830 Muscle spasm of back: Secondary | ICD-10-CM

## 2023-06-03 DIAGNOSIS — R2689 Other abnormalities of gait and mobility: Secondary | ICD-10-CM

## 2023-06-03 NOTE — Therapy (Signed)
OUTPATIENT PHYSICAL THERAPY TREATMENT   Patient Name: Linda Winters MRN: [id number] DOB:11/22/1975, 47 y.o., female Today's Date: 06/03/2023  END OF SESSION:  PT End of Session - 06/03/23 0804     Visit Number 2    Date for PT Re-Evaluation 07/22/23    Authorization Type [employer] - VL: 20    PT Start Time 0804    PT Stop Time 0851    PT Time Calculation (min) 47 min    Activity Tolerance Patient tolerated treatment well    Behavior During Therapy WFL for tasks assessed/performed             Past Medical History:  Diagnosis Date   Anxiety    Depression    Endometriosis    Hemoglobin C trait (HCC)    Hyperlipidemia    NSVD (normal spontaneous vaginal delivery)    X 2   Past Surgical History:  Procedure Laterality Date   CYSTOSCOPY N/A 05/26/2017   Procedure: CYSTOSCOPY;  Surgeon: Dara Lords, MD;  Location: [facility];  Service: Gynecology;  Laterality: N/A;   LAPAROSCOPIC VAGINAL HYSTERECTOMY WITH SALPINGECTOMY Bilateral 05/26/2017   Procedure: LAPAROSCOPIC ASSISTED VAGINAL HYSTERECTOMY WITH SALPINGECTOMY;  Surgeon: Dara Lords, MD;  Location: [facility];  Service: Gynecology;  Laterality: Bilateral;  requests 7:30am OR time  requests 2 1/2 hours   PELVIC LAPAROSCOPY     WISDOM TOOTH EXTRACTION     Patient Active Problem List   Diagnosis Date Noted   Obesity (BMI 30.0-34.9) 04/05/2019   Hyperlipidemia 04/05/2019   Vitamin D deficiency 04/05/2019   Vitamin B12 deficiency 04/05/2019    PCP: Philip Aspen, Limmie Patricia, MD   REFERRING PROVIDER: Deeann Saint, MD   REFERRING DIAG:  [phone] (ICD-10-CM) - Acute bilateral low back pain with left-sided sciatica  M51.26 (ICD-10-CM) - Lumbar disc herniation   THERAPY DIAG:  Acute bilateral low back pain with left-sided sciatica  Muscle weakness (generalized)  Muscle spasm of back  Other abnormalities of gait and mobility  RATIONALE FOR EVALUATION AND TREATMENT: Rehabilitation  ONSET DATE: 6-8  months  NEXT MD VISIT: 06/11/23 with Richardean Sale, MD   SUBJECTIVE:                                                                                                                                                                                                         SUBJECTIVE STATEMENT: Pt reports she has only attempted the standing lumbar extension exercises but not the prone. She has not had any recent episodes where her back feels like it is  going out with transitional movements.  EVAL:  Pt reports 6-8 month h/o episodes of her "back going out". When she is in an episode, she notes the most pain during sit to stand transitions and for the first few steps while walking. She also notes difficulty with prolonged sitting, esp on firm seat. Wants to work on strengthening her core to minimize recurrence. Has been prescribed muscle relaxants but has not yet need to use them - mostly gets relief from rest and heating pad.  PAIN: Are you having pain? No  PERTINENT HISTORY:  Anxiety, depression, endometriosis, obesity  PRECAUTIONS: None  RED FLAGS: None  WEIGHT BEARING RESTRICTIONS: No  FALLS:  Has patient fallen in last 6 months? 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 To learn exercises I can do on my to strengthen my back to help manage when I am in an episode."   OBJECTIVE: (objective measures completed at initial evaluation unless otherwise dated)  DIAGNOSTIC FINDINGS:  05/04/23 - CT abdomen and pelvis: Musculoskeletal: Disc herniations are noted at L2-L3 and L3-L4 calcification of the disc annulus. No acute osseous abnormality.  PATIENT SURVEYS:   Modified Oswestry 13 / 50 = 26.0 %  FOTO Lumbar: 63, predicted 77  SCREENING FOR RED FLAGS: Bowel or bladder incontinence: No Spinal tumors: No Cauda equina syndrome: No Compression fracture: No Abdominal aneurysm: No  COGNITION:  Overall cognitive status: Within functional limits for tasks assessed    SENSATION: WFL Intermittent numbness and tingling in L posterior thigh and calf during an episode  MUSCLE LENGTH: Hamstrings: Mild tight B ITB: WFL Piriformis: Mild tight B Hip flexors: WFL Quads: WFL Heelcord: WFL  POSTURE:  Mild rounded shoulders, forward head, and increased thoracic kyphosis  PALPATION: Increased muscle tension and TTP in left lumbar paraspinals and upper glutes.  LUMBAR ROM:   Active  Eval  Flexion WFL  Extension WFL  Right lateral flexion Hand to lateral knee ^  Left lateral flexion Hand to lateral knee  Right rotation WFL  Left rotation WFL  (Blank rows = not tested, ^ = pinching pain)  LOWER EXTREMITY ROM:    B LE ROM grossly WFL   LOWER EXTREMITY MMT:    MMT Right eval Left eval  Hip flexion 4 4  Hip extension 4- 4-  Hip abduction 4 4-  Hip adduction 4 4-  Hip internal rotation 4 4  Hip external rotation 4- 4-  Knee flexion 4 4  Knee extension 4+ 4+  Ankle dorsiflexion 4 4  Ankle plantarflexion 4+  15 SLS HR 4+  14 SLS HR  Ankle inversion    Ankle eversion     (Blank rows = not tested)  LUMBAR SPECIAL TESTS:  Straight leg raise test: Negative   TODAY'S TREATMENT:   06/03/23 THERAPEUTIC EXERCISE: to improve flexibility, strength and mobility.  Demonstration, verbal and tactile cues throughout for technique.  Rec Bike - L2 x 6 min Supine & hooklying HS stretches with strap x 30" each bil - no preference per pt Hooklying figure-4 piriformis stretch with slight over pressure x 30" Hooklying KTOS piriformis stretch x 30" Seated alternatives for above 3 stretches demonstrated but not attempted Hooklying  LTR 10 x  10" Hooklying PPT 10 x 5" Hooklying TrA/PPT + hip ADD ball squeeze isometric 10 x 5" Hooklying TrA/PPT + GTB bent-knee fallout 10 x 3" Hooklying TrA/PPT + GTB hip flexion march 10 x 3" Bridge + GTB hip ABD isometric 10 x 3"  THERAPEUTIC ACTIVITIES: Provided education in proper posture and body mechanics for typical daily positioning and household chores to minimize strain on low back.   05/27/23 - Eval THERAPEUTIC EXERCISE: to improve flexibility, strength and mobility.  Demonstration, verbal and tactile cues throughout for technique.  Instruction in initial HEP to be utilized to help decrease pain and radicular symptoms during a flare-up/episode.  THERAPEUTIC ACTIVITIES: Provided education in neutral spine positioning in sitting during work meetings to facilitate normal lumbar lordosis   PATIENT EDUCATION:  Education details: PT eval findings, anticipated POC, initial HEP, and concept of centralizationn of pain and radicular symptoms Person educated: Patient Education method: Explanation, Demonstration, Verbal cues, and MedBridgeGO access code texted to patient Education comprehension: verbalized understanding and needs further education  HOME EXERCISE PROGRAM: *Access Code: [email or web address] URL: [email or web address] Date: 06/03/2023 Prepared by: Glenetta Hew  Exercises - Static Prone on Elbows  - 2 x daily - 7 x weekly - 2 sets - 3 reps - 30 sec hold - Prone Press Up  - 2 x daily - 7 x weekly - 2 sets - 10 reps - 2 sec hold - Standing Lumbar Extension at Wall - Forearms  - 2 x daily - 7 x weekly - 2 sets - 10 reps - 3 sec hold - Standing Lumbar Extension with Counter  - 2 x daily - 7 x weekly - 2 sets - 10 reps - 3 sec hold - Hooklying Hamstring Stretch with Strap  - 1-2 x daily - 7 x weekly - 3 reps - 30 sec hold - Seated Hamstring Stretch  - 1-2 x daily - 7 x weekly - 3 reps - 30 sec hold - Supine Piriformis Stretch with Foot on Ground  - 1-2 x daily - 7 x weekly - 3  reps - 30 sec hold - Seated Piriformis Stretch  - 1-2 x daily - 7 x weekly - 3 reps - 30 sec hold - Supine Figure 4 Piriformis Stretch  - 1-2 x daily - 7 x weekly - 3 reps - 30 sec hold - Seated Figure 4 Piriformis Stretch  - 1-2 x daily - 7 x weekly - 3 reps - 30 sec hold - Supine Lower Trunk Rotation  - 1-2 x daily - 7 x weekly - 10 reps - 10 sec hold - Supine Posterior Pelvic Tilt  - 1-2 x daily - 7 x weekly - 2 sets - 10 reps - 5 sec hold - Supine Hip Adduction Isometric with Ball  - 1 x daily - 7 x weekly - 2 sets - 10 reps - 5 sec hold - Hooklying Single Leg Bent Knee Fallouts with Resistance  - 1 x daily - 7 x weekly - 2 sets - 10 reps - 3 sec hold - Supine March with Resistance Band  - 1 x daily - 7 x weekly - 2 sets - 10 reps - 2-3 sec hold hold - Bridge with Resistance  - 1 x daily - 7 x weekly - 2 sets - 10 reps - 5 sec hold  Patient Education - Posture and Body Mechanics  *Pt using MedBridgeGO app  ASSESSMENT:  CLINICAL IMPRESSION: Timber reports she has only attempted the standing  trunk extensions, but not the prone - no issues noted and no episodes of her pain in the past week.  Education provided in proper posture and body mechanics for typical daily positioning and household chores to minimize strain on low back, with pt noting areas for improvement. Introduced basic lumbopelvic flexibility and core/proximal strengthening with good tolerance, updating HEP accordingly.  Khristie will benefit from continued skilled PT to address ongoing deficits to improve mobility and activity tolerance with decreased pain interference.   OBJECTIVE IMPAIRMENTS: decreased activity tolerance, decreased endurance, decreased knowledge of condition, decreased mobility, difficulty walking, decreased ROM, decreased strength, hypomobility, increased fascial restrictions, impaired perceived functional ability, increased muscle spasms, impaired flexibility, impaired sensation, improper body mechanics, postural  dysfunction, and pain.   ACTIVITY LIMITATIONS: carrying, lifting, bending, sitting, standing, squatting, sleeping, transfers, bathing, dressing, hygiene/grooming, locomotion level, and caring for others  PARTICIPATION LIMITATIONS: meal prep, cleaning, laundry, driving, shopping, community activity, and occupation  PERSONAL FACTORS: Fitness, Past/current experiences, Time since onset of injury/illness/exacerbation, and 3+ comorbidities: Anxiety, depression, endometriosis, obesity  are also affecting patient's functional outcome.   REHAB POTENTIAL: Good  CLINICAL DECISION MAKING: Stable/uncomplicated  EVALUATION COMPLEXITY: Low   GOALS: Goals reviewed with patient? Yes  SHORT TERM GOALS: Target date: 06/24/2023  Patient will be independent with initial HEP to improve outcomes and carryover.  Baseline:  Goal status: IN PROGRESS  2.  Patient will report centralization of radicular symptoms.  Baseline: intermittent sharp pinching pain with numbness and tingling into L calf Goal status: IN PROGRESS  LONG TERM GOALS: Target date: 07/22/2023  Patient will be independent with ongoing/advanced HEP for self-management at home.  Baseline:  Goal status: IN PROGRESS  2.  Patient will report 50-75% improvement in low back pain to improve QOL.  Baseline: pain up to 8-9/10 during flare-up episode Goal status: IN PROGRESS  3.  Patient to demonstrate ability to achieve and maintain good spinal alignment/posturing and body mechanics needed for daily activities. Baseline:  Goal status: IN PROGRESS  4.  Patient will demonstrate full pain free lumbar ROM to perform ADLs.   Baseline: Refer to above lumbar ROM table Goal status: IN PROGRESS  5.  Patient will demonstrate improved B LE strength to >/= 4+/5 for improved stability and ease of mobility . Baseline: Refer to LE MMT table Goal status: IN PROGRESS  6.  Patient will report >/= 77 on lumbar FOTO to demonstrate improved functional  ability.  Baseline: 63 Goal status: IN PROGRESS   7. Patient will report </= 14% on Modified Oswestry to demonstrate improved functional ability with decreased pain interference. Baseline: 13 / 50 = 26.0 % Goal status: IN PROGRESS  8.  Patient will ability to complete sit to stand and walk w/o increased LBP. Baseline: Pain typically most severe during an episode with STS transition and initiation of walking Goal status: IN PROGRESS   PLAN:  PT FREQUENCY: 1-2x/week -  pt wishing to try 1x/wk frequency to start  PT DURATION: 8 weeks  PLANNED INTERVENTIONS: Therapeutic exercises, Therapeutic activity, Neuromuscular re-education, Balance training, Gait training, Patient/Family education, Self Care, Joint mobilization, Aquatic Therapy, Dry Needling, Electrical stimulation, Spinal manipulation, Spinal mobilization, Cryotherapy, Moist heat, Taping, Traction, Ultrasound, Ionotophoresis 4mg /ml Dexamethasone, Manual therapy, and Re-evaluation  PLAN FOR NEXT SESSION: progress lumbopelvic flexibility and strengthening - regular HEP updates due to 1x/wk frequency; MT +/- DN to address abnormal muscle tension in L lumbar paraspinals and upper glutes   Marry Guan, PT 06/03/2023, 9:06 AM

## 2023-06-10 ENCOUNTER — Ambulatory Visit: Payer: 59

## 2023-06-10 ENCOUNTER — Other Ambulatory Visit: Payer: Self-pay | Admitting: Sports Medicine

## 2023-06-10 DIAGNOSIS — R2689 Other abnormalities of gait and mobility: Secondary | ICD-10-CM

## 2023-06-10 DIAGNOSIS — M5442 Lumbago with sciatica, left side: Secondary | ICD-10-CM

## 2023-06-10 DIAGNOSIS — M62830 Muscle spasm of back: Secondary | ICD-10-CM

## 2023-06-10 DIAGNOSIS — M6281 Muscle weakness (generalized): Secondary | ICD-10-CM

## 2023-06-10 NOTE — Therapy (Signed)
OUTPATIENT PHYSICAL THERAPY TREATMENT   Patient Name: Linda Winters MRN: [id number] DOB:1975/10/08, 47 y.o., female Today's Date: 06/10/2023  END OF SESSION:  PT End of Session - 06/10/23 0851     Visit Number 3    Date for PT Re-Evaluation 07/22/23    Authorization Type [employer] - VL: 20    PT Start Time 0846    PT Stop Time 0930    PT Time Calculation (min) 44 min    Activity Tolerance Patient tolerated treatment well    Behavior During Therapy WFL for tasks assessed/performed              Past Medical History:  Diagnosis Date   Anxiety    Depression    Endometriosis    Hemoglobin C trait (HCC)    Hyperlipidemia    NSVD (normal spontaneous vaginal delivery)    X 2   Past Surgical History:  Procedure Laterality Date   CYSTOSCOPY N/A 05/26/2017   Procedure: CYSTOSCOPY;  Surgeon: Dara Lords, MD;  Location: [facility];  Service: Gynecology;  Laterality: N/A;   LAPAROSCOPIC VAGINAL HYSTERECTOMY WITH SALPINGECTOMY Bilateral 05/26/2017   Procedure: LAPAROSCOPIC ASSISTED VAGINAL HYSTERECTOMY WITH SALPINGECTOMY;  Surgeon: Dara Lords, MD;  Location: [facility];  Service: Gynecology;  Laterality: Bilateral;  requests 7:30am OR time  requests 2 1/2 hours   PELVIC LAPAROSCOPY     WISDOM TOOTH EXTRACTION     Patient Active Problem List   Diagnosis Date Noted   Obesity (BMI 30.0-34.9) 04/05/2019   Hyperlipidemia 04/05/2019   Vitamin D deficiency 04/05/2019   Vitamin B12 deficiency 04/05/2019    PCP: Philip Aspen, Limmie Patricia, MD   REFERRING PROVIDER: Deeann Saint, MD   REFERRING DIAG:  [phone] (ICD-10-CM) - Acute bilateral low back pain with left-sided sciatica  M51.26 (ICD-10-CM) - Lumbar disc herniation   THERAPY DIAG:  Acute bilateral low back pain with left-sided sciatica  Muscle weakness (generalized)  Muscle spasm of back  Other abnormalities of gait and mobility  RATIONALE FOR EVALUATION AND TREATMENT: Rehabilitation  ONSET DATE: 6-8  months  NEXT MD VISIT: 06/11/23 with Richardean Sale, MD   SUBJECTIVE:                                                                                                                                                                                                         SUBJECTIVE STATEMENT: Pt reports that she is doing HEP but noting some soreness from it. No flare up type of pain since starting HEP.   EVAL:  Pt reports  6-8 month h/o episodes of her "back going out". When she is in an episode, she notes the most pain during sit to stand transitions and for the first few steps while walking. She also notes difficulty with prolonged sitting, esp on firm seat. Wants to work on strengthening her core to minimize recurrence. Has been prescribed muscle relaxants but has not yet need to use them - mostly gets relief from rest and heating pad.  PAIN: Are you having pain? Yes: NPRS scale: 2/10 Pain location: low back Pain description: sore  PERTINENT HISTORY:  Anxiety, depression, endometriosis, obesity  PRECAUTIONS: None  RED FLAGS: None  WEIGHT BEARING RESTRICTIONS: No  FALLS:  Has patient fallen in last 6 months? 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 To learn exercises I can do on my to strengthen my back to help manage when I am in an episode."   OBJECTIVE: (objective measures completed at initial evaluation unless otherwise dated)  DIAGNOSTIC FINDINGS:  05/04/23 - CT abdomen and pelvis: Musculoskeletal: Disc herniations are noted at L2-L3 and L3-L4 calcification of the disc annulus. No acute osseous abnormality.  PATIENT SURVEYS:  Modified Oswestry 13 /  50 = 26.0 %  FOTO Lumbar: 63, predicted 77  SCREENING FOR RED FLAGS: Bowel or bladder incontinence: No Spinal tumors: No Cauda equina syndrome: No Compression fracture: No Abdominal aneurysm: No  COGNITION:  Overall cognitive status: Within functional limits for tasks assessed    SENSATION: WFL Intermittent numbness and tingling in L posterior thigh and calf during an episode  MUSCLE LENGTH: Hamstrings: Mild tight B ITB: WFL Piriformis: Mild tight B Hip flexors: WFL Quads: WFL Heelcord: WFL  POSTURE:  Mild rounded shoulders, forward head, and increased thoracic kyphosis  PALPATION: Increased muscle tension and TTP in left lumbar paraspinals and upper glutes.  LUMBAR ROM:   Active  Eval  Flexion WFL  Extension WFL  Right lateral flexion Hand to lateral knee ^  Left lateral flexion Hand to lateral knee  Right rotation WFL  Left rotation WFL  (Blank rows = not tested, ^ = pinching pain)  LOWER EXTREMITY ROM:    B LE ROM grossly WFL   LOWER EXTREMITY MMT:    MMT Right eval Left eval  Hip flexion 4 4  Hip extension 4- 4-  Hip abduction 4 4-  Hip adduction 4 4-  Hip internal rotation 4 4  Hip external rotation 4- 4-  Knee flexion 4 4  Knee extension 4+ 4+  Ankle dorsiflexion 4 4  Ankle plantarflexion 4+  15 SLS HR 4+  14 SLS HR  Ankle inversion    Ankle eversion     (Blank rows = not tested)  LUMBAR SPECIAL TESTS:  Straight leg raise test: Negative   TODAY'S TREATMENT:  06/10/23 THERAPEUTIC EXERCISE: to improve flexibility, strength and mobility.  Demonstration, verbal and tactile cues throughout for technique.  Rec Bike - L2 x 6 min Hooklying LTR 3x10" each way Bridges 10x5" with hip ADD ball squeeze Bridges 10x5" with GTB hip ABD S/L clamshells GTB 10x5" bil S/L hip abduction x 10 bil Seated piriformis stretch x 30 sec bil Standing rows GTB x 10  06/03/23 THERAPEUTIC  EXERCISE: to improve flexibility, strength and mobility.  Demonstration,  verbal and tactile cues throughout for technique.  Rec Bike - L2 x 6 min Supine & hooklying HS stretches with strap x 30" each bil - no preference per pt Hooklying figure-4 piriformis stretch with slight over pressure x 30" Hooklying KTOS piriformis stretch x 30" Seated alternatives for above 3 stretches demonstrated but not attempted Hooklying LTR 10 x 10" Hooklying PPT 10 x 5" Hooklying TrA/PPT + hip ADD ball squeeze isometric 10 x 5" Hooklying TrA/PPT + GTB bent-knee fallout 10 x 3" Hooklying TrA/PPT + GTB hip flexion march 10 x 3" Bridge + GTB hip ABD isometric 10 x 3"  THERAPEUTIC ACTIVITIES: Provided education in proper posture and body mechanics for typical daily positioning and household chores to minimize strain on low back.   05/27/23 - Eval THERAPEUTIC EXERCISE: to improve flexibility, strength and mobility.  Demonstration, verbal and tactile cues throughout for technique.  Instruction in initial HEP to be utilized to help decrease pain and radicular symptoms during a flare-up/episode.  THERAPEUTIC ACTIVITIES: Provided education in neutral spine positioning in sitting during work meetings to facilitate normal lumbar lordosis   PATIENT EDUCATION:  Education details: HEP update Person educated: Patient Education method: Explanation, Demonstration, Verbal cues, and MedBridgeGO access code texted to patient Education comprehension: verbalized understanding and needs further education  HOME EXERCISE PROGRAM: *Access Code: [email or web address] URL: [email or web address] Date: 06/10/2023 Prepared by: Verta Ellen  Exercises - Static Prone on Elbows  - 2 x daily - 7 x weekly - 2 sets - 3 reps - 30 sec hold - Prone Press Up  - 2 x daily - 7 x weekly - 2 sets - 10 reps - 2 sec hold - Standing Lumbar Extension at Wall - Forearms  - 2 x daily - 7 x weekly - 2 sets - 10 reps - 3 sec hold - Standing Lumbar Extension with Counter  - 2 x daily - 7 x weekly - 2 sets - 10 reps - 3  sec hold - Hooklying Hamstring Stretch with Strap  - 1-2 x daily - 7 x weekly - 3 reps - 30 sec hold - Seated Hamstring Stretch  - 1-2 x daily - 7 x weekly - 3 reps - 30 sec hold - Supine Piriformis Stretch with Foot on Ground  - 1-2 x daily - 7 x weekly - 3 reps - 30 sec hold - Seated Piriformis Stretch  - 1-2 x daily - 7 x weekly - 3 reps - 30 sec hold - Supine Figure 4 Piriformis Stretch  - 1-2 x daily - 7 x weekly - 3 reps - 30 sec hold - Seated Figure 4 Piriformis Stretch  - 1-2 x daily - 7 x weekly - 3 reps - 30 sec hold - Supine Lower Trunk Rotation  - 1-2 x daily - 7 x weekly - 10 reps - 10 sec hold - Supine Posterior Pelvic Tilt  - 1-2 x daily - 7 x weekly - 2 sets - 10 reps - 5 sec hold - Supine Hip Adduction Isometric with Ball  - 1 x daily - 7 x weekly - 2 sets - 10 reps - 5 sec hold - Supine March with Resistance Band  - 1 x daily - 7 x weekly - 2 sets - 10 reps - 2-3 sec hold hold - Bridge with Resistance  - 1 x daily - 7 x weekly - 2 sets - 10 reps - 5 sec hold -  Clamshell with Resistance  - 1 x daily - 3 x weekly - 2 sets - 10 reps - Sidelying Hip Abduction  - 1 x daily - 3 x weekly - 2 sets - 10 reps  Patient Education - Posture and Body Mechanics  *Pt using MedBridgeGO app  ASSESSMENT:  CLINICAL IMPRESSION:   Progressed through exercises for lumbo-pelvic, hip, and postural strength. Cues required with S/L hip abduction to remain on side to avoid compensation. Updates made to HEP for progressed hip strengthening. Good tolerance for TE, only reports of fatigue in glutes after exercises. Zyanne will benefit from continued skilled PT to address ongoing deficits to improve mobility and activity tolerance with decreased pain interference.   OBJECTIVE IMPAIRMENTS: decreased activity tolerance, decreased endurance, decreased knowledge of condition, decreased mobility, difficulty walking, decreased ROM, decreased strength, hypomobility, increased fascial restrictions, impaired  perceived functional ability, increased muscle spasms, impaired flexibility, impaired sensation, improper body mechanics, postural dysfunction, and pain.   ACTIVITY LIMITATIONS: carrying, lifting, bending, sitting, standing, squatting, sleeping, transfers, bathing, dressing, hygiene/grooming, locomotion level, and caring for others  PARTICIPATION LIMITATIONS: meal prep, cleaning, laundry, driving, shopping, community activity, and occupation  PERSONAL FACTORS: Fitness, Past/current experiences, Time since onset of injury/illness/exacerbation, and 3+ comorbidities: Anxiety, depression, endometriosis, obesity  are also affecting patient's functional outcome.   REHAB POTENTIAL: Good  CLINICAL DECISION MAKING: Stable/uncomplicated  EVALUATION COMPLEXITY: Low   GOALS: Goals reviewed with patient? Yes  SHORT TERM GOALS: Target date: 06/24/2023  Patient will be independent with initial HEP to improve outcomes and carryover.  Baseline:  Goal status: MET- 06/10/23  2.  Patient will report centralization of radicular symptoms.  Baseline: intermittent sharp pinching pain with numbness and tingling into L calf Goal status: IN PROGRESS  LONG TERM GOALS: Target date: 07/22/2023  Patient will be independent with ongoing/advanced HEP for self-management at home.  Baseline:  Goal status: IN PROGRESS  2.  Patient will report 50-75% improvement in low back pain to improve QOL.  Baseline: pain up to 8-9/10 during flare-up episode Goal status: IN PROGRESS  3.  Patient to demonstrate ability to achieve and maintain good spinal alignment/posturing and body mechanics needed for daily activities. Baseline:  Goal status: IN PROGRESS  4.  Patient will demonstrate full pain free lumbar ROM to perform ADLs.   Baseline: Refer to above lumbar ROM table Goal status: IN PROGRESS  5.  Patient will demonstrate improved B LE strength to >/= 4+/5 for improved stability and ease of mobility . Baseline: Refer  to LE MMT table Goal status: IN PROGRESS  6.  Patient will report >/= 77 on lumbar FOTO to demonstrate improved functional ability.  Baseline: 63 Goal status: IN PROGRESS   7. Patient will report </= 14% on Modified Oswestry to demonstrate improved functional ability with decreased pain interference. Baseline: 13 / 50 = 26.0 % Goal status: IN PROGRESS  8.  Patient will ability to complete sit to stand and walk w/o increased LBP. Baseline: Pain typically most severe during an episode with STS transition and initiation of walking Goal status: IN PROGRESS   PLAN:  PT FREQUENCY: 1-2x/week -  pt wishing to try 1x/wk frequency to start  PT DURATION: 8 weeks  PLANNED INTERVENTIONS: Therapeutic exercises, Therapeutic activity, Neuromuscular re-education, Balance training, Gait training, Patient/Family education, Self Care, Joint mobilization, Aquatic Therapy, Dry Needling, Electrical stimulation, Spinal manipulation, Spinal mobilization, Cryotherapy, Moist heat, Taping, Traction, Ultrasound, Ionotophoresis 4mg /ml Dexamethasone, Manual therapy, and Re-evaluation  PLAN FOR NEXT SESSION: progress lumbopelvic flexibility  and strengthening - regular HEP updates due to 1x/wk frequency; MT +/- DN to address abnormal muscle tension in L lumbar paraspinals and upper glutes   Darleene Cleaver, PTA 06/10/2023, 9:31 AM

## 2023-06-11 ENCOUNTER — Ambulatory Visit: Payer: 59 | Admitting: Sports Medicine

## 2023-06-17 ENCOUNTER — Encounter: Payer: 59 | Admitting: Physical Therapy

## 2023-06-24 ENCOUNTER — Ambulatory Visit: Payer: 59 | Attending: Family Medicine

## 2023-06-24 DIAGNOSIS — M62830 Muscle spasm of back: Secondary | ICD-10-CM | POA: Diagnosis present

## 2023-06-24 DIAGNOSIS — R2689 Other abnormalities of gait and mobility: Secondary | ICD-10-CM

## 2023-06-24 DIAGNOSIS — M5442 Lumbago with sciatica, left side: Secondary | ICD-10-CM | POA: Diagnosis present

## 2023-06-24 DIAGNOSIS — M6281 Muscle weakness (generalized): Secondary | ICD-10-CM | POA: Diagnosis present

## 2023-06-24 NOTE — Therapy (Signed)
OUTPATIENT PHYSICAL THERAPY TREATMENT   Patient Name: Linda Winters MRN: [id number] DOB:04/23/1976, 47 y.o., female Today's Date: 06/24/2023  END OF SESSION:  PT End of Session - 06/24/23 0803     Visit Number 4    Date for PT Re-Evaluation 07/22/23    Authorization Type [employer] - VL: 20    PT Start Time 0759    PT Stop Time 0848    PT Time Calculation (min) 49 min    Activity Tolerance Patient tolerated treatment well    Behavior During Therapy WFL for tasks assessed/performed               Past Medical History:  Diagnosis Date   Anxiety    Depression    Endometriosis    Hemoglobin C trait (HCC)    Hyperlipidemia    NSVD (normal spontaneous vaginal delivery)    X 2   Past Surgical History:  Procedure Laterality Date   CYSTOSCOPY N/A 05/26/2017   Procedure: CYSTOSCOPY;  Surgeon: Dara Lords, MD;  Location: [facility];  Service: Gynecology;  Laterality: N/A;   LAPAROSCOPIC VAGINAL HYSTERECTOMY WITH SALPINGECTOMY Bilateral 05/26/2017   Procedure: LAPAROSCOPIC ASSISTED VAGINAL HYSTERECTOMY WITH SALPINGECTOMY;  Surgeon: Dara Lords, MD;  Location: [facility];  Service: Gynecology;  Laterality: Bilateral;  requests 7:30am OR time  requests 2 1/2 hours   PELVIC LAPAROSCOPY     WISDOM TOOTH EXTRACTION     Patient Active Problem List   Diagnosis Date Noted   Obesity (BMI 30.0-34.9) 04/05/2019   Hyperlipidemia 04/05/2019   Vitamin D deficiency 04/05/2019   Vitamin B12 deficiency 04/05/2019    PCP: Philip Aspen, Limmie Patricia, MD   REFERRING PROVIDER: Deeann Saint, MD   REFERRING DIAG:  [id number] (ICD-10-CM) - Acute bilateral low back pain with left-sided sciatica  M51.26 (ICD-10-CM) - Lumbar disc herniation   THERAPY DIAG:  Acute bilateral low back pain with left-sided sciatica  Muscle weakness (generalized)  Muscle spasm of back  Other abnormalities of gait and mobility  RATIONALE FOR EVALUATION AND TREATMENT: Rehabilitation  ONSET DATE: 6-8  months  NEXT MD VISIT: 06/11/23 with Richardean Sale, MD   SUBJECTIVE:                                                                                                                                                                                                         SUBJECTIVE STATEMENT: Pt reports only doing exercises once last week, no pain today.  EVAL:  Pt reports 6-8 month h/o episodes of her "back going out". When she  is in an episode, she notes the most pain during sit to stand transitions and for the first few steps while walking. She also notes difficulty with prolonged sitting, esp on firm seat. Wants to work on strengthening her core to minimize recurrence. Has been prescribed muscle relaxants but has not yet need to use them - mostly gets relief from rest and heating pad.  PAIN: Are you having pain? No  PERTINENT HISTORY:  Anxiety, depression, endometriosis, obesity  PRECAUTIONS: None  RED FLAGS: None  WEIGHT BEARING RESTRICTIONS: No  FALLS:  Has patient fallen in last 6 months? 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 To learn exercises I can do on my to strengthen my back to help manage when I am in an episode."   OBJECTIVE: (objective measures completed at initial evaluation unless otherwise dated)  DIAGNOSTIC FINDINGS:  05/04/23 - CT abdomen and pelvis: Musculoskeletal: Disc herniations are noted at L2-L3 and L3-L4 calcification of the disc annulus. No acute osseous abnormality.  PATIENT SURVEYS:  Modified Oswestry 13 / 50 = 26.0 %  FOTO Lumbar: 63, predicted 77  SCREENING FOR RED FLAGS: Bowel or bladder incontinence: No Spinal  tumors: No Cauda equina syndrome: No Compression fracture: No Abdominal aneurysm: No  COGNITION:  Overall cognitive status: Within functional limits for tasks assessed    SENSATION: WFL Intermittent numbness and tingling in L posterior thigh and calf during an episode  MUSCLE LENGTH: Hamstrings: Mild tight B ITB: WFL Piriformis: Mild tight B Hip flexors: WFL Quads: WFL Heelcord: WFL  POSTURE:  Mild rounded shoulders, forward head, and increased thoracic kyphosis  PALPATION: Increased muscle tension and TTP in left lumbar paraspinals and upper glutes.  LUMBAR ROM:   Active  Eval  Flexion WFL  Extension WFL  Right lateral flexion Hand to lateral knee ^  Left lateral flexion Hand to lateral knee  Right rotation WFL  Left rotation WFL  (Blank rows = not tested, ^ = pinching pain)  LOWER EXTREMITY ROM:    B LE ROM grossly WFL   LOWER EXTREMITY MMT:    MMT Right eval Left eval  Hip flexion 4 4  Hip extension 4- 4-  Hip abduction 4 4-  Hip adduction 4 4-  Hip internal rotation 4 4  Hip external rotation 4- 4-  Knee flexion 4 4  Knee extension 4+ 4+  Ankle dorsiflexion 4 4  Ankle plantarflexion 4+  15 SLS HR 4+  14 SLS HR  Ankle inversion    Ankle eversion     (Blank rows = not tested)  LUMBAR SPECIAL TESTS:  Straight leg raise test: Negative   TODAY'S TREATMENT:  06/24/23 THERAPEUTIC EXERCISE: to improve flexibility, strength and mobility.  Demonstration, verbal and tactile cues throughout for technique.  Rec Bike - L3 x 6 min With bil UE support: Standing hip abduction 2 x 10 RTB at ankles  Standing extension 2 x 10 RTB at ankles  Standing hip flexion 2 x 10 RTB at ankles  Supine alt LE toe taps with TrA 2x10 Supine bike pedals with TrA 2 x 10  Straight leg bridge orange Pball 2x10 Cat/cow x 10 each direction Childs pose 3 way  x 30 sec each   06/10/23 THERAPEUTIC EXERCISE: to improve flexibility, strength and mobility.  Demonstration, verbal and  tactile cues throughout for technique.  Rec Bike - L2 x 6 min Hooklying LTR 3x10" each way Bridges 10x5" with hip ADD ball squeeze Bridges 10x5" with GTB hip ABD S/L clamshells GTB 10x5" bil S/L hip abduction x 10 bil Seated piriformis stretch x 30 sec bil Standing rows GTB x 10  06/03/23 THERAPEUTIC EXERCISE: to improve flexibility, strength and mobility.  Demonstration, verbal and tactile cues throughout for technique.  Rec Bike - L2 x 6 min Supine & hooklying HS stretches with strap x 30" each bil - no preference per pt Hooklying figure-4 piriformis stretch with slight over pressure x 30" Hooklying KTOS piriformis stretch x 30" Seated alternatives for above 3 stretches demonstrated but not attempted Hooklying LTR 10 x 10" Hooklying PPT 10 x 5" Hooklying TrA/PPT + hip ADD ball squeeze isometric 10 x 5" Hooklying TrA/PPT + GTB bent-knee fallout 10 x 3" Hooklying TrA/PPT + GTB hip flexion march 10 x 3" Bridge + GTB hip ABD isometric 10 x 3"  THERAPEUTIC ACTIVITIES: Provided education in proper posture and body mechanics for typical daily positioning and household chores to minimize strain on low back.   05/27/23 - Eval THERAPEUTIC EXERCISE: to improve flexibility, strength and mobility.  Demonstration, verbal and tactile cues throughout for technique.  Instruction in initial HEP to be utilized to help decrease pain and radicular symptoms during a flare-up/episode.  THERAPEUTIC ACTIVITIES: Provided education in neutral spine positioning in sitting during work meetings to facilitate normal lumbar lordosis   PATIENT EDUCATION:  Education details: HEP update Person educated: Patient Education method: Explanation, Demonstration, Verbal cues, and MedBridgeGO access code texted to patient Education comprehension: verbalized understanding and needs further education  HOME EXERCISE PROGRAM: *Access Code: [email or web address] URL: [email or web address] Date: 06/24/2023 Prepared by:  Verta Ellen  Exercises - Prone Press Up  - 2 x daily - 7 x weekly - 2 sets - 10 reps - 2 sec hold - Standing Lumbar Extension at Wall - Forearms  - 2 x daily - 7 x weekly - 2 sets - 10 reps - 3 sec hold - Hooklying Hamstring Stretch with Strap  - 1-2 x daily - 7 x weekly - 3 reps - 30 sec hold - Seated Hamstring Stretch  - 1-2 x daily - 7 x weekly - 3 reps - 30 sec hold - Supine Piriformis Stretch with Foot on Ground  - 1-2 x daily - 7 x weekly - 3 reps - 30 sec hold - Seated Piriformis Stretch  - 1-2 x daily - 7 x weekly - 3 reps - 30 sec hold - Supine Figure 4 Piriformis Stretch  - 1-2 x daily - 7 x weekly - 3 reps - 30 sec hold - Seated Figure 4 Piriformis Stretch  - 1-2 x daily - 7 x weekly - 3 reps - 30 sec hold - Supine March with Resistance Band  - 1 x daily - 7 x weekly - 2 sets - 10 reps - 2-3 sec hold hold - Bridge with Resistance  - 1 x daily - 7 x weekly - 2 sets - 10 reps - 5 sec hold - Clamshell with Resistance  - 1 x daily - 3 x weekly - 2 sets - 10 reps - Sidelying Hip Abduction  - 1 x daily - 3 x weekly - 2 sets - 10 reps - Supine 90/90 Alternating Toe Touch  -  1 x daily - 3 x weekly - 2 sets - 10 reps - Supine Bicycles  - 1 x daily - 3 x weekly - 2 sets - 10 reps  Patient Education - Posture and Body Mechanics  *Pt using MedBridgeGO app  ASSESSMENT:  CLINICAL IMPRESSION:   Progressed through exercises for lumbo-pelvic and hip strength. She is doing well with no reports of pain, her only complaint is muscle soreness after doing her exercises. Good demo of exercises with min cues required. Did require cues with STS for anterior weight shift and scoot to the edge of chair to reduce compression on low back. Core stabilization exercises also added to HEP. Linda Winters will benefit from continued skilled PT to address ongoing deficits to improve mobility and activity tolerance with decreased pain interference.   OBJECTIVE IMPAIRMENTS: decreased activity tolerance, decreased  endurance, decreased knowledge of condition, decreased mobility, difficulty walking, decreased ROM, decreased strength, hypomobility, increased fascial restrictions, impaired perceived functional ability, increased muscle spasms, impaired flexibility, impaired sensation, improper body mechanics, postural dysfunction, and pain.   ACTIVITY LIMITATIONS: carrying, lifting, bending, sitting, standing, squatting, sleeping, transfers, bathing, dressing, hygiene/grooming, locomotion level, and caring for others  PARTICIPATION LIMITATIONS: meal prep, cleaning, laundry, driving, shopping, community activity, and occupation  PERSONAL FACTORS: Fitness, Past/current experiences, Time since onset of injury/illness/exacerbation, and 3+ comorbidities: Anxiety, depression, endometriosis, obesity  are also affecting patient's functional outcome.   REHAB POTENTIAL: Good  CLINICAL DECISION MAKING: Stable/uncomplicated  EVALUATION COMPLEXITY: Low   GOALS: Goals reviewed with patient? Yes  SHORT TERM GOALS: Target date: 06/24/2023  Patient will be independent with initial HEP to improve outcomes and carryover.  Baseline:  Goal status: MET- 06/10/23  2.  Patient will report centralization of radicular symptoms.  Baseline: intermittent sharp pinching pain with numbness and tingling into L calf Goal status: MET- 06/24/23  LONG TERM GOALS: Target date: 07/22/2023  Patient will be independent with ongoing/advanced HEP for self-management at home.  Baseline:  Goal status: IN PROGRESS  2.  Patient will report 50-75% improvement in low back pain to improve QOL.  Baseline: pain up to 8-9/10 during flare-up episode Goal status: MET- 06/24/23 no pain over the past week  3.  Patient to demonstrate ability to achieve and maintain good spinal alignment/posturing and body mechanics needed for daily activities. Baseline:  Goal status: IN PROGRESS  4.  Patient will demonstrate full pain free lumbar ROM to perform  ADLs.   Baseline: Refer to above lumbar ROM table Goal status: IN PROGRESS  5.  Patient will demonstrate improved B LE strength to >/= 4+/5 for improved stability and ease of mobility . Baseline: Refer to LE MMT table Goal status: IN PROGRESS  6.  Patient will report >/= 77 on lumbar FOTO to demonstrate improved functional ability.  Baseline: 63 Goal status: IN PROGRESS   7. Patient will report </= 14% on Modified Oswestry to demonstrate improved functional ability with decreased pain interference. Baseline: 13 / 50 = 26.0 % Goal status: IN PROGRESS  8.  Patient will ability to complete sit to stand and walk w/o increased LBP. Baseline: Pain typically most severe during an episode with STS transition and initiation of walking Goal status: IN PROGRESS   PLAN:  PT FREQUENCY: 1-2x/week -  pt wishing to try 1x/wk frequency to start  PT DURATION: 8 weeks  PLANNED INTERVENTIONS: Therapeutic exercises, Therapeutic activity, Neuromuscular re-education, Balance training, Gait training, Patient/Family education, Self Care, Joint mobilization, Aquatic Therapy, Dry Needling, Electrical stimulation, Spinal manipulation, Spinal  mobilization, Cryotherapy, Moist heat, Taping, Traction, Ultrasound, Ionotophoresis 4mg /ml Dexamethasone, Manual therapy, and Re-evaluation  PLAN FOR NEXT SESSION: progress lumbopelvic flexibility and strengthening - regular HEP updates due to 1x/wk frequency; MT +/- DN to address abnormal muscle tension in L lumbar paraspinals and upper glutes   Lylia Karn L Marjorie Deprey, PTA 06/24/2023, 8:55 AM

## 2023-07-01 ENCOUNTER — Ambulatory Visit: Payer: 59 | Admitting: Physical Therapy

## 2023-07-01 ENCOUNTER — Encounter: Payer: Self-pay | Admitting: Physical Therapy

## 2023-07-01 DIAGNOSIS — M5442 Lumbago with sciatica, left side: Secondary | ICD-10-CM | POA: Diagnosis not present

## 2023-07-01 DIAGNOSIS — M62830 Muscle spasm of back: Secondary | ICD-10-CM

## 2023-07-01 DIAGNOSIS — M6281 Muscle weakness (generalized): Secondary | ICD-10-CM

## 2023-07-01 DIAGNOSIS — R2689 Other abnormalities of gait and mobility: Secondary | ICD-10-CM

## 2023-07-01 NOTE — Therapy (Signed)
OUTPATIENT PHYSICAL THERAPY TREATMENT   Patient Name: Linda Winters MRN: [id number] DOB:[birth year], 47 y.o., female Today's Date: 07/01/2023  END OF SESSION:  PT End of Session - 07/01/23 0802     Visit Number 5    Date for PT Re-Evaluation 07/22/23    Authorization Type [employer] - VL: 20    PT Start Time 0802    PT Stop Time 0846    PT Time Calculation (min) 44 min    Activity Tolerance Patient tolerated treatment well    Behavior During Therapy WFL for tasks assessed/performed               Past Medical History:  Diagnosis Date   Anxiety    Depression    Endometriosis    Hemoglobin C trait (HCC)    Hyperlipidemia    NSVD (normal spontaneous vaginal delivery)    X 2   Past Surgical History:  Procedure Laterality Date   CYSTOSCOPY N/A 05/26/2017   Procedure: CYSTOSCOPY;  Surgeon: Dara Lords, MD;  Location: [facility];  Service: Gynecology;  Laterality: N/A;   LAPAROSCOPIC VAGINAL HYSTERECTOMY WITH SALPINGECTOMY Bilateral 05/26/2017   Procedure: LAPAROSCOPIC ASSISTED VAGINAL HYSTERECTOMY WITH SALPINGECTOMY;  Surgeon: Dara Lords, MD;  Location: [facility];  Service: Gynecology;  Laterality: Bilateral;  requests 7:30am OR time  requests 2 1/2 hours   PELVIC LAPAROSCOPY     WISDOM TOOTH EXTRACTION     Patient Active Problem List   Diagnosis Date Noted   Obesity (BMI 30.0-34.9) 04/05/2019   Hyperlipidemia 04/05/2019   Vitamin D deficiency 04/05/2019   Vitamin B12 deficiency 04/05/2019    PCP: Philip Aspen, Limmie Patricia, MD   REFERRING PROVIDER: Deeann Saint, MD   REFERRING DIAG:  [phone] (ICD-10-CM) - Acute bilateral low back pain with left-sided sciatica  M51.26 (ICD-10-CM) - Lumbar disc herniation   THERAPY DIAG:  Acute bilateral low back pain with left-sided sciatica  Muscle weakness (generalized)  Muscle spasm of back  Other abnormalities of gait and mobility  RATIONALE FOR EVALUATION AND TREATMENT: Rehabilitation  ONSET DATE: 6-8  months  NEXT MD VISIT: TBD   SUBJECTIVE:                                                                                                                                                                                                         SUBJECTIVE STATEMENT: Pt reports no recent pain, only occasional soreness from doing the exercises at home.  EVAL:  Pt reports 6-8 month h/o episodes of her "back going out". When she is  in an episode, she notes the most pain during sit to stand transitions and for the first few steps while walking. She also notes difficulty with prolonged sitting, esp on firm seat. Wants to work on strengthening her core to minimize recurrence. Has been prescribed muscle relaxants but has not yet need to use them - mostly gets relief from rest and heating pad.  PAIN: Are you having pain? No  PERTINENT HISTORY:  Anxiety, depression, endometriosis, obesity  PRECAUTIONS: None  RED FLAGS: None  WEIGHT BEARING RESTRICTIONS: No  FALLS:  Has patient fallen in last 6 months? 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 To learn exercises I can do on my to strengthen my back to help manage when I am in an episode."   OBJECTIVE: (objective measures completed at initial evaluation unless otherwise dated)  DIAGNOSTIC FINDINGS:  05/04/23 - CT abdomen and pelvis: Musculoskeletal: Disc herniations are noted at L2-L3 and L3-L4 calcification of the disc annulus. No acute osseous abnormality.  PATIENT SURVEYS:  Modified Oswestry 13 / 50 = 26.0 %  FOTO Lumbar: 63, predicted 77  SCREENING FOR RED FLAGS: Bowel or bladder incontinence: No Spinal tumors:  No Cauda equina syndrome: No Compression fracture: No Abdominal aneurysm: No  COGNITION:  Overall cognitive status: Within functional limits for tasks assessed    SENSATION: WFL Intermittent numbness and tingling in L posterior thigh and calf during an episode  MUSCLE LENGTH: Hamstrings: Mild tight B ITB: WFL Piriformis: Mild tight B Hip flexors: WFL Quads: WFL Heelcord: WFL  POSTURE:  Mild rounded shoulders, forward head, and increased thoracic kyphosis  PALPATION: Increased muscle tension and TTP in left lumbar paraspinals and upper glutes.  LUMBAR ROM:   Active  Eval 07/01/23  Flexion WFL WFL  Extension [facility] WFL  Right lateral flexion Hand to lateral knee ^ WFL - pull in L low back  Left lateral flexion Hand to lateral knee WFL  Right rotation [facility] 2 WFL  Left rotation [facility] WFL  (Blank rows = not tested, ^ = pinching pain)  LOWER EXTREMITY ROM:    B LE ROM grossly WFL   LOWER EXTREMITY MMT:    MMT Right eval Left eval R 07/01/23 L 07/01/23  Hip flexion 4 4 5 5   Hip extension 4- 4- 4 4  Hip abduction 4 4- 4 4  Hip adduction 4 4- 4 4  Hip internal rotation 4 4 5 5   Hip external rotation 4- 4- 4+ 4  Knee flexion 4 4 5 5   Knee extension 4+ 4+ 5 5  Ankle dorsiflexion 4 4 5  4+  Ankle plantarflexion 4+  15 SLS HR 4+  14 SLS HR 5 5  Ankle inversion      Ankle eversion       (Blank rows = not tested)  LUMBAR SPECIAL TESTS:  Straight leg raise test: Negative   TODAY'S TREATMENT:   07/01/23 THERAPEUTIC EXERCISE: to improve flexibility, strength and mobility.  Demonstration, verbal and tactile cues throughout for technique.  NuStep - L5 x 6 min Functional squat + OH UE raise 10 x 5" Standing hip clam (flexion/ABD/ER) x 10 bil Standing hip ABD isometric into counter (or wall) 10  x 3-5" Standing hip ER isometric into counter (or wall) 10 x 3-5" Standing TrA + RTB scap retraction & shoulder rows 10 x 3", 2 sets - 2nd set in staggered stance for increased TrA  activation Standing TrA + RTB scap retraction & shoulder extension 10 x 3", 2 sets - 2nd set in staggered stance for increased TrA activation Standing RTB pallof press x 10 bil Standing RTB pallof press  THERAPEUTIC ACTIVITIES: Lumbar ROM LE MMT    06/24/23 THERAPEUTIC EXERCISE: to improve flexibility, strength and mobility.  Demonstration, verbal and tactile cues throughout for technique.  Rec Bike - L3 x 6 min With bil UE support: Standing hip abduction 2 x 10 RTB at ankles  Standing extension 2 x 10 RTB at ankles  Standing hip flexion 2 x 10 RTB at ankles  Supine alt LE toe taps with TrA 2x10 Supine bike pedals with TrA 2 x 10  Straight leg bridge orange Pball 2x10 Cat/cow x 10 each direction Childs pose 3 way x 30 sec each    06/10/23 THERAPEUTIC EXERCISE: to improve flexibility, strength and mobility.  Demonstration, verbal and tactile cues throughout for technique.  Rec Bike - L2 x 6 min Hooklying LTR 3x10" each way Bridges 10x5" with hip ADD ball squeeze Bridges 10x5" with GTB hip ABD S/L clamshells GTB 10x5" bil S/L hip abduction x 10 bil Seated piriformis stretch x 30 sec bil Standing rows GTB x 10    PATIENT EDUCATION:  Education details: HEP update Person educated: Patient Education method: Explanation, Demonstration, Verbal cues, and MedBridgeGO access code texted to patient Education comprehension: verbalized understanding and needs further education  HOME EXERCISE PROGRAM: *Access Code: [email or web address] URL: [email or web address] Date: 07/01/2023 Prepared by: Glenetta Hew  Exercises - Prone Press Up  - 2 x daily - 7 x weekly - 2 sets - 10 reps - 2 sec hold - Standing Lumbar Extension at Wall - Forearms  - 2 x daily - 7 x weekly - 2 sets - 10 reps - 3 sec hold - Hooklying Hamstring Stretch with Strap  - 1-2 x daily - 7 x weekly - 3 reps - 30 sec hold - Seated Hamstring Stretch  - 1-2 x daily - 7 x weekly - 3 reps - 30 sec hold - Supine Piriformis  Stretch with Foot on Ground  - 1-2 x daily - 7 x weekly - 3 reps - 30 sec hold - Seated Piriformis Stretch  - 1-2 x daily - 7 x weekly - 3 reps - 30 sec hold - Supine Figure 4 Piriformis Stretch  - 1-2 x daily - 7 x weekly - 3 reps - 30 sec hold - Seated Figure 4 Piriformis Stretch  - 1-2 x daily - 7 x weekly - 3 reps - 30 sec hold - Supine March with Resistance Band  - 1 x daily - 7 x weekly - 2 sets - 10 reps - 2-3 sec hold hold - Bridge with Resistance  - 1 x daily - 3 x weekly - 2 sets - 10 reps - 5 sec hold - Clamshell with Resistance  - 1 x daily - 3 x weekly - 2 sets - 10 reps - Sidelying Hip Abduction  - 1 x daily - 3 x weekly - 2 sets - 10 reps - Supine 90/90 Alternating Toe Touch  - 1 x daily - 3 x weekly - 2 sets - 10 reps - Supine Bicycles  - 1 x daily - 3 x weekly -  2 sets - 10 reps - Deep Squat with Arms Overhead  - 1 x daily - 3 x weekly - 2 sets - 10 reps - 3 sec hold - Frogger  - 1 x daily - 3 x weekly - 2 sets - 10 reps - 3 sec hold - Standing Hip External Rotation at Wall  - 1 x daily - 3 x weekly - 2 sets - 10 reps - 3 sec hold - Isometric Gluteus Medius at Wall  - 1 x daily - 3 x weekly - 2 sets - 10 reps - 3 sec hold - Standing Shoulder Row with Anchored Resistance  - 1 x daily - 3 x weekly - 2 sets - 10 reps - 5 sec hold - Scapular Retraction with Resistance Advanced  - 1 x daily - 3 x weekly - 2 sets - 10 reps - 5 sec hold - Anti-Rotation Press With Sidesteps and Anchored Resistance  - 1 x daily - 3 x weekly - 2 sets - 10 reps - 3 sec hold  Patient Education - Posture and Body Mechanics  *Pt using MedBridgeGO app  ASSESSMENT:  CLINICAL IMPRESSION: Lea reports her back pain has remained well controlled for the past few weeks, noting only occasional muscle soreness after completing her HEP.  She is able to complete STS transitions and walk without LBP.  She has demonstrated improvement in her overall LE MMT/strength, however continued weakness still evident  proximally at hips.  She reports that HEP is going well other than mild muscle soreness post exercise, particularly after hip strengthening exercises.  She inquired about exercises that she could perform in standing on her breaks at work, therefore provided instruction in exercises but did not require equipment or resistance bands.  Encouraged her to make sure she takes adequate recovery period after performing strengthening exercises, typically performing resisted strengthening only every other day.  Ruchama is pleased with her progress with LTG's #2, 4, and 8 now met.  She will benefit from continued skilled PT to address ongoing deficits to improve mobility and activity tolerance with decreased pain interference, but should likely be ready to transition to her HEP by the end of the current POC.  OBJECTIVE IMPAIRMENTS: decreased activity tolerance, decreased endurance, decreased knowledge of condition, decreased mobility, difficulty walking, decreased ROM, decreased strength, hypomobility, increased fascial restrictions, impaired perceived functional ability, increased muscle spasms, impaired flexibility, impaired sensation, improper body mechanics, postural dysfunction, and pain.   ACTIVITY LIMITATIONS: carrying, lifting, bending, sitting, standing, squatting, sleeping, transfers, bathing, dressing, hygiene/grooming, locomotion level, and caring for others  PARTICIPATION LIMITATIONS: meal prep, cleaning, laundry, driving, shopping, community activity, and occupation  PERSONAL FACTORS: Fitness, Past/current experiences, Time since onset of injury/illness/exacerbation, and 3+ comorbidities: Anxiety, depression, endometriosis, obesity  are also affecting patient's functional outcome.   REHAB POTENTIAL: Good  CLINICAL DECISION MAKING: Stable/uncomplicated  EVALUATION COMPLEXITY: Low   GOALS: Goals reviewed with patient? Yes  SHORT TERM GOALS: Target date: 06/24/2023  Patient will be independent  with initial HEP to improve outcomes and carryover.  Baseline:  Goal status: MET - 06/10/23  2.  Patient will report centralization of radicular symptoms.  Baseline: intermittent sharp pinching pain with numbness and tingling into L calf Goal status: MET - 06/24/23  LONG TERM GOALS: Target date: 07/22/2023  Patient will be independent with ongoing/advanced HEP for self-management at home.  Baseline:  Goal status: IN PROGRESS  2.  Patient will report 50-75% improvement in low back pain to improve QOL.  Baseline: pain up to 8-9/10 during flare-up episode Goal status: MET - 07/01/23 - no pain over the past 2 weeks  3.  Patient to demonstrate ability to achieve and maintain good spinal alignment/posturing and body mechanics needed for daily activities. Baseline:  Goal status: IN PROGRESS  4.  Patient will demonstrate full pain free lumbar ROM to perform ADLs.   Baseline: Refer to above lumbar ROM table Goal status: MET - 07/01/23  5.  Patient will demonstrate improved B LE strength to >/= 4+/5 for improved stability and ease of mobility . Baseline: Refer to LE MMT table Goal status: PARTIALLY MET - 07/01/23 - met for knees and ankles, but continued hip weakness  6.  Patient will report >/= 77 on lumbar FOTO to demonstrate improved functional ability.  Baseline: 63 Goal status: IN PROGRESS   7. Patient will report </= 14% on Modified Oswestry to demonstrate improved functional ability with decreased pain interference. Baseline: 13 / 50 = 26.0 % Goal status: IN PROGRESS  8.  Patient will ability to complete sit to stand and walk w/o increased LBP. Baseline: Pain typically most severe during an episode with STS transition and initiation of walking Goal status: MET - 07/01/23   PLAN:  PT FREQUENCY: 1-2x/week -  pt wishing to try 1x/wk frequency to start  PT DURATION: 8 weeks  PLANNED INTERVENTIONS: Therapeutic exercises, Therapeutic activity, Neuromuscular re-education, Balance  training, Gait training, Patient/Family education, Self Care, Joint mobilization, Aquatic Therapy, Dry Needling, Electrical stimulation, Spinal manipulation, Spinal mobilization, Cryotherapy, Moist heat, Taping, Traction, Ultrasound, Ionotophoresis 4mg /ml Dexamethasone, Manual therapy, and Re-evaluation  PLAN FOR NEXT SESSION: progress lumbopelvic flexibility and strengthening - regular HEP updates due to 1x/wk frequency; MT +/- DN to address abnormal muscle tension in L lumbar paraspinals and upper glutes   Marry Guan, PT 07/01/2023, 1:39 PM

## 2023-07-08 ENCOUNTER — Encounter: Payer: 59 | Admitting: Physical Therapy

## 2023-07-09 ENCOUNTER — Other Ambulatory Visit: Payer: Self-pay | Admitting: Internal Medicine

## 2023-07-09 DIAGNOSIS — Z1231 Encounter for screening mammogram for malignant neoplasm of breast: Secondary | ICD-10-CM

## 2023-07-15 ENCOUNTER — Ambulatory Visit: Payer: 59 | Admitting: Physical Therapy

## 2023-07-15 ENCOUNTER — Encounter: Payer: Self-pay | Admitting: Physical Therapy

## 2023-07-15 DIAGNOSIS — M5442 Lumbago with sciatica, left side: Secondary | ICD-10-CM

## 2023-07-15 DIAGNOSIS — R2689 Other abnormalities of gait and mobility: Secondary | ICD-10-CM

## 2023-07-15 DIAGNOSIS — M62830 Muscle spasm of back: Secondary | ICD-10-CM

## 2023-07-15 DIAGNOSIS — M6281 Muscle weakness (generalized): Secondary | ICD-10-CM

## 2023-07-15 NOTE — Therapy (Signed)
OUTPATIENT PHYSICAL THERAPY TREATMENT / DISCHARGE SUMMARY   Patient Name: Linda Winters MRN: [id number] DOB:1975-12-20, 47 y.o., female Today's Date: 07/15/2023  END OF SESSION:  PT End of Session - 07/15/23 0804     Visit Number 6    Date for PT Re-Evaluation 07/22/23    Authorization Type U[employer] - VL: 20    PT Start Time 0804    PT Stop Time 0830    PT Time Calculation (min) 26 min    Activity Tolerance Patient tolerated treatment well    Behavior During Therapy WFL for tasks assessed/performed                Past Medical History:  Diagnosis Date   Anxiety    Depression    Endometriosis    Hemoglobin C trait (HCC)    Hyperlipidemia    NSVD (normal spontaneous vaginal delivery)    X 2   Past Surgical History:  Procedure Laterality Date   CYSTOSCOPY N/A 05/26/2017   Procedure: CYSTOSCOPY;  Surgeon: Dara Lords, MD;  Location: [facility];  Service: Gynecology;  Laterality: N/A;   LAPAROSCOPIC VAGINAL HYSTERECTOMY WITH SALPINGECTOMY Bilateral 05/26/2017   Procedure: LAPAROSCOPIC ASSISTED VAGINAL HYSTERECTOMY WITH SALPINGECTOMY;  Surgeon: Dara Lords, MD;  Location: [facility];  Service: Gynecology;  Laterality: Bilateral;  requests 7:30am OR time  requests 2 1/2 hours   PELVIC LAPAROSCOPY     WISDOM TOOTH EXTRACTION     Patient Active Problem List   Diagnosis Date Noted   Obesity (BMI 30.0-34.9) 04/05/2019   Hyperlipidemia 04/05/2019   Vitamin D deficiency 04/05/2019   Vitamin B12 deficiency 04/05/2019    PCP: Philip Aspen, Limmie Patricia, MD   REFERRING PROVIDER: Deeann Saint, MD   REFERRING DIAG:  [phone] (ICD-10-CM) - Acute bilateral low back pain with left-sided sciatica  M51.26 (ICD-10-CM) - Lumbar disc herniation   THERAPY DIAG:  Acute bilateral low back pain with left-sided sciatica  Muscle weakness (generalized)  Muscle spasm of back  Other abnormalities of gait and mobility  RATIONALE FOR EVALUATION AND TREATMENT:  Rehabilitation  ONSET DATE: 6-8 months  NEXT MD VISIT: TBD   SUBJECTIVE:                                                                                                                                                                                                         SUBJECTIVE STATEMENT: Pt reports no recent pain, and feels ready to try transitioning to her HEP.  EVAL:  Pt reports 6-8 month h/o episodes of her "back going  out". When she is in an episode, she notes the most pain during sit to stand transitions and for the first few steps while walking. She also notes difficulty with prolonged sitting, esp on firm seat. Wants to work on strengthening her core to minimize recurrence. Has been prescribed muscle relaxants but has not yet need to use them - mostly gets relief from rest and heating pad.  PAIN: Are you having pain? No  PERTINENT HISTORY:  Anxiety, depression, endometriosis, obesity  PRECAUTIONS: None  RED FLAGS: None  WEIGHT BEARING RESTRICTIONS: No  FALLS:  Has patient fallen in last 6 months? 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 To learn exercises I can do on my to strengthen my back to help manage when I am in an episode."   OBJECTIVE: (objective measures completed at initial evaluation unless otherwise dated)  DIAGNOSTIC FINDINGS:  05/04/23 - CT abdomen and pelvis: Musculoskeletal: Disc herniations are noted at L2-L3 and L3-L4 calcification of the disc annulus. No acute osseous abnormality.  PATIENT SURVEYS:  Modified Oswestry 13 / 50 = 26.0 %  FOTO Lumbar: 63, predicted 77  SCREENING FOR RED FLAGS: Bowel or bladder  incontinence: No Spinal tumors: No Cauda equina syndrome: No Compression fracture: No Abdominal aneurysm: No  COGNITION:  Overall cognitive status: Within functional limits for tasks assessed    SENSATION: WFL Intermittent numbness and tingling in L posterior thigh and calf during an episode  MUSCLE LENGTH: Hamstrings: Mild tight B ITB: WFL Piriformis: Mild tight B Hip flexors: WFL Quads: WFL Heelcord: WFL  POSTURE:  Mild rounded shoulders, forward head, and increased thoracic kyphosis  PALPATION: Increased muscle tension and TTP in left lumbar paraspinals and upper glutes.  LUMBAR ROM:   Active  Eval 07/01/23 07/15/23  Flexion WFL WFL WFL  Extension [facility] WFL WFL  Right lateral flexion Hand to lateral knee ^ WFL - pull in L low back WFL  Left lateral flexion Hand to lateral knee [facility] WFL  Right rotation [facility] WFL  Left rotation [facility] WFL  (Blank rows = not tested, ^ = pinching pain)  LOWER EXTREMITY ROM:    B LE ROM grossly [facility]   LOWER EXTREMITY MMT:    MMT Right eval Left eval R 07/01/23 L 07/01/23 R 07/15/23 L 07/15/23  Hip flexion 4 4 5 5 5 5   Hip extension 4- 4- 4 4 4+ 4+  Hip abduction 4 4- 4 4 5 5   Hip adduction 4 4- 4 4 4+ 4+  Hip internal rotation 4 4 5 5 5 5   Hip external rotation 4- 4- 4+ 4 5 4+  Knee flexion 4 4 5 5 5 5   Knee extension 4+ 4+ 5 5 5 5   Ankle dorsiflexion 4 4 5  4+ 5 5  Ankle plantarflexion 4+  15 SLS HR 4+  14 SLS HR 5 5 5 5   Ankle inversion        Ankle eversion         (Blank rows = not tested)  LUMBAR SPECIAL TESTS:  Straight leg raise test: Negative   TODAY'S TREATMENT:   07/15/23 THERAPEUTIC EXERCISE: to improve flexibility, strength and mobility.  Demonstration,  verbal and tactile cues throughout for technique.  Rec Bike - L4 x 6 min Verbal review of HEP - pt denies need for formal review  THERAPEUTIC ACTIVITIES: Lumbar FOTO: 69 Modified Oswestry: 8 / 50 = 16.0 % Lumbar ROM LE MMT Goal  assessment   07/01/23 THERAPEUTIC EXERCISE: to improve flexibility, strength and mobility.  Demonstration, verbal and tactile cues throughout for technique.  NuStep - L5 x 6 min Functional squat + OH UE raise 10 x 5" Standing hip clam (flexion/ABD/ER) x 10 bil Standing hip ABD isometric into counter (or wall) 10 x 3-5" Standing hip ER isometric into counter (or wall) 10 x 3-5" Standing TrA + RTB scap retraction & shoulder rows 10 x 3", 2 sets - 2nd set in staggered stance for increased TrA activation Standing TrA + RTB scap retraction & shoulder extension 10 x 3", 2 sets - 2nd set in staggered stance for increased TrA activation Standing RTB pallof press x 10 bil Standing RTB pallof press  THERAPEUTIC ACTIVITIES: Lumbar ROM LE MMT   06/24/23 THERAPEUTIC EXERCISE: to improve flexibility, strength and mobility.  Demonstration, verbal and tactile cues throughout for technique.  Rec Bike - L3 x 6 min With bil UE support: Standing hip abduction 2 x 10 RTB at ankles  Standing extension 2 x 10 RTB at ankles  Standing hip flexion 2 x 10 RTB at ankles  Supine alt LE toe taps with TrA 2x10 Supine bike pedals with TrA 2 x 10  Straight leg bridge orange Pball 2x10 Cat/cow x 10 each direction Childs pose 3 way x 30 sec each    PATIENT EDUCATION:  Education details: HEP review, continue with current HEP, and recommended frequency for ongoing HEP at discharge to prevent loss of gains achieved with PT Person educated: Patient Education method: Explanation and MedBridgeGO access code verified Education comprehension: verbalized understanding  HOME EXERCISE PROGRAM: *Access Code: [email or web address] URL: [email or web address] Date: 07/01/2023 Prepared by: Glenetta Hew  Exercises - Prone Press Up  - 2 x daily - 7 x weekly - 2 sets - 10 reps - 2 sec hold - Standing Lumbar Extension at Wall - Forearms  - 2 x daily - 7 x weekly - 2 sets - 10 reps - 3 sec hold - Hooklying Hamstring Stretch  with Strap  - 1-2 x daily - 7 x weekly - 3 reps - 30 sec hold - Seated Hamstring Stretch  - 1-2 x daily - 7 x weekly - 3 reps - 30 sec hold - Supine Piriformis Stretch with Foot on Ground  - 1-2 x daily - 7 x weekly - 3 reps - 30 sec hold - Seated Piriformis Stretch  - 1-2 x daily - 7 x weekly - 3 reps - 30 sec hold - Supine Figure 4 Piriformis Stretch  - 1-2 x daily - 7 x weekly - 3 reps - 30 sec hold - Seated Figure 4 Piriformis Stretch  - 1-2 x daily - 7 x weekly - 3 reps - 30 sec hold - Supine March with Resistance Band  - 1 x daily - 7 x weekly - 2 sets - 10 reps - 2-3 sec hold hold - Bridge with Resistance  - 1 x daily - 3 x weekly - 2 sets - 10 reps - 5 sec hold - Clamshell with Resistance  - 1 x daily - 3 x weekly - 2 sets - 10 reps - Sidelying Hip Abduction  - 1 x daily - 3 x weekly -  2 sets - 10 reps - Supine 90/90 Alternating Toe Touch  - 1 x daily - 3 x weekly - 2 sets - 10 reps - Supine Bicycles  - 1 x daily - 3 x weekly - 2 sets - 10 reps - Deep Squat with Arms Overhead  - 1 x daily - 3 x weekly - 2 sets - 10 reps - 3 sec hold - Frogger  - 1 x daily - 3 x weekly - 2 sets - 10 reps - 3 sec hold - Standing Hip External Rotation at Wall  - 1 x daily - 3 x weekly - 2 sets - 10 reps - 3 sec hold - Isometric Gluteus Medius at Wall  - 1 x daily - 3 x weekly - 2 sets - 10 reps - 3 sec hold - Standing Shoulder Row with Anchored Resistance  - 1 x daily - 3 x weekly - 2 sets - 10 reps - 5 sec hold - Scapular Retraction with Resistance Advanced  - 1 x daily - 3 x weekly - 2 sets - 10 reps - 5 sec hold - Anti-Rotation Press With Sidesteps and Anchored Resistance  - 1 x daily - 3 x weekly - 2 sets - 10 reps - 3 sec hold  Patient Education - Posture and Body Mechanics  *Pt using MedBridgeGO app  ASSESSMENT:  CLINICAL IMPRESSION: Winsome reports 80% improvement in her LBP - she has not had any recent muscle spasms in her back and the day-to-day tightness is not what it was.  Lumbar ROM now  WFL/WNL w/o limitations and overall LE strength now 4+ to 5/5.  She reports good understanding of proper posture and body mechanics to protect her back and denies need for review.  HEP verbally reviewed with pt denying need for physical practice or clarification of any exercises.  We discussed frequency for ongoing HEP performance to prevent loss of gains achieved with PT.  All PT goals now met with only exception of patient outcomes measures demonstrating improvement but not fully met.  Omunique feels ready to transition to her HEP, therefore will proceed with discharge from physical therapy for this episode.  OBJECTIVE IMPAIRMENTS: decreased activity tolerance, decreased endurance, decreased knowledge of condition, decreased mobility, difficulty walking, decreased ROM, decreased strength, hypomobility, increased fascial restrictions, impaired perceived functional ability, increased muscle spasms, impaired flexibility, impaired sensation, improper body mechanics, postural dysfunction, and pain.   ACTIVITY LIMITATIONS: carrying, lifting, bending, sitting, standing, squatting, sleeping, transfers, bathing, dressing, hygiene/grooming, locomotion level, and caring for others  PARTICIPATION LIMITATIONS: meal prep, cleaning, laundry, driving, shopping, community activity, and occupation  PERSONAL FACTORS: Fitness, Past/current experiences, Time since onset of injury/illness/exacerbation, and 3+ comorbidities: Anxiety, depression, endometriosis, obesity  are also affecting patient's functional outcome.   REHAB POTENTIAL: Good  CLINICAL DECISION MAKING: Stable/uncomplicated  EVALUATION COMPLEXITY: Low   GOALS: Goals reviewed with patient? Yes  SHORT TERM GOALS: Target date: 06/24/2023  Patient will be independent with initial HEP to improve outcomes and carryover.  Baseline:  Goal status: MET - 06/10/23  2.  Patient will report centralization of radicular symptoms.  Baseline: intermittent sharp  pinching pain with numbness and tingling into L calf Goal status: MET - 06/24/23  LONG TERM GOALS: Target date: 07/22/2023  Patient will be independent with ongoing/advanced HEP for self-management at home.  Baseline:  Goal status: IN PROGRESS - 07/15/23  2.  Patient will report 50-75% improvement in low back pain to improve QOL.  Baseline: pain up to  8-9/10 during flare-up episode Goal status: MET - 07/15/23 - 80% improvement  3.  Patient to demonstrate ability to achieve and maintain good spinal alignment/posturing and body mechanics needed for daily activities. Baseline:  Goal status: MET - 07/15/23  4.  Patient will demonstrate full pain free lumbar ROM to perform ADLs.   Baseline: Refer to above lumbar ROM table Goal status: MET - 07/01/23  5.  Patient will demonstrate improved B LE strength to >/= 4+/5 for improved stability and ease of mobility . Baseline: Refer to LE MMT table Goal status: MET - 07/15/23   6.  Patient will report >/= 77 on lumbar FOTO to demonstrate improved functional ability.  Baseline: 63 Goal status: IN PROGRESS - 07/15/23 - 69  7. Patient will report </= 14% on Modified Oswestry to demonstrate improved functional ability with decreased pain interference. Baseline: 13 / 50 = 26.0 % Goal status: PARTIALLY MET - 07/15/23 - 8 / 50 = 16.0 %  8.  Patient will ability to complete sit to stand and walk w/o increased LBP. Baseline: Pain typically most severe during an episode with STS transition and initiation of walking Goal status: MET - 07/01/23   PLAN:  PT FREQUENCY: 1-2x/week -  pt wishing to try 1x/wk frequency to start  PT DURATION: 8 weeks  PLANNED INTERVENTIONS: Therapeutic exercises, Therapeutic activity, Neuromuscular re-education, Balance training, Gait training, Patient/Family education, Self Care, Joint mobilization, Aquatic Therapy, Dry Needling, Electrical stimulation, Spinal manipulation, Spinal mobilization, Cryotherapy, Moist heat,  Taping, Traction, Ultrasound, Ionotophoresis 4mg /ml Dexamethasone, Manual therapy, and Re-evaluation  PLAN FOR NEXT SESSION: transition to HEP + D/C from PT   PHYSICAL THERAPY DISCHARGE SUMMARY  Visits from Start of Care: 6  Current functional level related to goals / functional outcomes: Refer to above clinical impression and goal assessment.   Remaining deficits: As above.   Education / Equipment: HEP, Hospital doctor education   Patient agrees to discharge. Patient goals were mostly met. Patient is being discharged due to being pleased with the current functional level.   Marry Guan, PT 07/15/2023, 8:32 AM

## 2023-10-13 ENCOUNTER — Telehealth: Payer: Self-pay | Admitting: Internal Medicine

## 2023-10-13 ENCOUNTER — Ambulatory Visit
Admission: RE | Admit: 2023-10-13 | Discharge: 2023-10-13 | Disposition: A | Discharge status: Home / Self Care | Payer: 59 | Source: Ambulatory Visit | Attending: Internal Medicine | Admitting: Internal Medicine

## 2023-10-13 DIAGNOSIS — Z1231 Encounter for screening mammogram for malignant neoplasm of breast: Secondary | ICD-10-CM

## 2023-10-13 NOTE — Telephone Encounter (Signed)
Called pt and left vm to r/s physical appt due to provider being out of office. Pls r/s appt.

## 2023-10-14 ENCOUNTER — Encounter: Payer: 59 | Admitting: Internal Medicine

## 2023-10-22 ENCOUNTER — Ambulatory Visit (INDEPENDENT_AMBULATORY_CARE_PROVIDER_SITE_OTHER): Payer: 59 | Admitting: Internal Medicine

## 2023-10-22 ENCOUNTER — Encounter: Payer: Self-pay | Admitting: Internal Medicine

## 2023-10-22 VITALS — BP 110/80 | HR 91 | Temp 98.7°F | Ht 67.0 in | Wt 214.0 lb

## 2023-10-22 DIAGNOSIS — Z0000 Encounter for general adult medical examination without abnormal findings: Secondary | ICD-10-CM | POA: Diagnosis not present

## 2023-10-22 DIAGNOSIS — Z1159 Encounter for screening for other viral diseases: Secondary | ICD-10-CM

## 2023-10-22 DIAGNOSIS — E785 Hyperlipidemia, unspecified: Secondary | ICD-10-CM | POA: Diagnosis not present

## 2023-10-22 DIAGNOSIS — E559 Vitamin D deficiency, unspecified: Secondary | ICD-10-CM | POA: Diagnosis not present

## 2023-10-22 DIAGNOSIS — E538 Deficiency of other specified B group vitamins: Secondary | ICD-10-CM

## 2023-10-22 DIAGNOSIS — Z23 Encounter for immunization: Secondary | ICD-10-CM

## 2023-10-22 DIAGNOSIS — Z114 Encounter for screening for human immunodeficiency virus [HIV]: Secondary | ICD-10-CM

## 2023-10-22 LAB — CBC WITH DIFFERENTIAL/PLATELET
Basophils Absolute: 0 10*3/uL (ref 0.0–0.1)
Basophils Relative: 0.4 % (ref 0.0–3.0)
Eosinophils Absolute: 0.2 10*3/uL (ref 0.0–0.7)
Eosinophils Relative: 1.9 % (ref 0.0–5.0)
HCT: 39.9 % (ref 36.0–46.0)
Hemoglobin: 13.5 g/dL (ref 12.0–15.0)
Lymphocytes Relative: 29.3 % (ref 12.0–46.0)
Lymphs Abs: 3.2 10*3/uL (ref 0.7–4.0)
MCHC: 33.9 g/dL (ref 30.0–36.0)
MCV: 89.6 fL (ref 78.0–100.0)
Monocytes Absolute: 0.7 10*3/uL (ref 0.1–1.0)
Monocytes Relative: 6.2 % (ref 3.0–12.0)
Neutro Abs: 6.8 10*3/uL (ref 1.4–7.7)
Neutrophils Relative %: 62.2 % (ref 43.0–77.0)
Platelets: 339 10*3/uL (ref 150.0–400.0)
RBC: 4.45 Mil/uL (ref 3.87–5.11)
RDW: 13.3 % (ref 11.5–15.5)
WBC: 10.9 10*3/uL — ABNORMAL HIGH (ref 4.0–10.5)

## 2023-10-22 LAB — COMPREHENSIVE METABOLIC PANEL
ALT: 9 U/L (ref 0–35)
AST: 11 U/L (ref 0–37)
Albumin: 4.5 g/dL (ref 3.5–5.2)
Alkaline Phosphatase: 69 U/L (ref 39–117)
BUN: 10 mg/dL (ref 6–23)
CO2: 29 meq/L (ref 19–32)
Calcium: 9.8 mg/dL (ref 8.4–10.5)
Chloride: 103 meq/L (ref 96–112)
Creatinine, Ser: 0.54 mg/dL (ref 0.40–1.20)
GFR: 109.94 mL/min (ref >60.00)
Glucose, Bld: 98 mg/dL (ref 70–99)
Potassium: 4.3 meq/L (ref 3.5–5.1)
Sodium: 141 meq/L (ref 135–145)
Total Bilirubin: 0.6 mg/dL (ref 0.2–1.2)
Total Protein: 7 g/dL (ref 6.0–8.3)

## 2023-10-22 LAB — LIPID PANEL
Cholesterol: 219 mg/dL — ABNORMAL HIGH (ref 0–200)
HDL: 49.9 mg/dL (ref >39.00)
LDL Cholesterol: 151 mg/dL — ABNORMAL HIGH (ref 0–99)
NonHDL: 169.04
Total CHOL/HDL Ratio: 4
Triglycerides: 92 mg/dL (ref 0.0–149.0)
VLDL: 18.4 mg/dL (ref 0.0–40.0)

## 2023-10-22 LAB — TSH: TSH: 1.98 u[IU]/mL (ref 0.35–5.50)

## 2023-10-22 LAB — VITAMIN D 25 HYDROXY (VIT D DEFICIENCY, FRACTURES): VITD: 19.43 ng/mL — ABNORMAL LOW (ref 30.00–100.00)

## 2023-10-22 LAB — VITAMIN B12: Vitamin B-12: 1008 pg/mL — ABNORMAL HIGH (ref 211–911)

## 2023-10-22 NOTE — Progress Notes (Signed)
Established Patient Office Visit     CC/Reason for Visit: Annual preventive exam  HPI: Linda Winters is a 48 y.o. female who is coming in today for the above mentioned reasons. Past Medical History is significant for: Hyperlipidemia, vitamin D and B12 deficiencies, obesity.  Feeling well without acute concerns or complaints.  Has routine eye and dental care.  Cancer screening is up-to-date.  She is due for Tdap and COVID vaccinations.   Past Medical/Surgical History: Past Medical History:  Diagnosis Date   Anxiety    Depression    Endometriosis    Hemoglobin C trait (HCC)    Hyperlipidemia    NSVD (normal spontaneous vaginal delivery)    X 2    Past Surgical History:  Procedure Laterality Date   CYSTOSCOPY N/A 05/26/2017   Procedure: CYSTOSCOPY;  Surgeon: Dara Lords, MD;  Location: [facility];  Service: Gynecology;  Laterality: N/A;   LAPAROSCOPIC VAGINAL HYSTERECTOMY WITH SALPINGECTOMY Bilateral 05/26/2017   Procedure: LAPAROSCOPIC ASSISTED VAGINAL HYSTERECTOMY WITH SALPINGECTOMY;  Surgeon: Dara Lords, MD;  Location: [facility];  Service: Gynecology;  Laterality: Bilateral;  requests 7:30am OR time  requests 2 1/2 hours   PELVIC LAPAROSCOPY     WISDOM TOOTH EXTRACTION      Social History:  reports that she quit smoking about 12 years ago. Her smoking use included cigarettes. She has never used smokeless tobacco. She reports current alcohol use. She reports that she does not use drugs.  Allergies: Allergies  Allergen Reactions   Compazine [Prochlorperazine Edisylate] Anxiety    Family History:  Family History  Problem Relation Age of Onset   Hypertension Mother    Diabetes Father    Hypertension Father    Heart disease Father    Colon polyps Sister    Esophageal cancer Maternal Uncle    Cancer Maternal Uncle        throat   Cancer Maternal Grandfather        prostate   Breast cancer Neg Hx    Colon cancer Neg Hx    Stomach cancer Neg Hx     Rectal cancer Neg Hx      Current Outpatient Medications:    cyanocobalamin (VITAMIN B12) 500 MCG tablet, Take 500 mcg by mouth daily., Disp: , Rfl:    cyclobenzaprine (FLEXERIL) 5 MG tablet, Take 1 tablet (5 mg total) by mouth 3 (three) times daily as needed for muscle spasms., Disp: 30 tablet, Rfl: 0   fluconazole (DIFLUCAN) 150 MG tablet, Take 1 tablet (150 mg total) by mouth every 3 (three) days., Disp: 2 tablet, Rfl: 0   HYDROcodone-acetaminophen (NORCO/VICODIN) 5-325 MG tablet, Take 1-2 tablets by mouth every 6 (six) hours as needed., Disp: 20 tablet, Rfl: 0   ibuprofen (ADVIL,MOTRIN) 600 MG tablet, Take 600 mg by mouth 4 (four) times daily as needed for cramping., Disp: , Rfl:    meloxicam (MOBIC) 15 MG tablet, Take 1 tablet (15 mg total) by mouth daily., Disp: 30 tablet, Rfl: 0   ondansetron (ZOFRAN-ODT) 4 MG disintegrating tablet, Take 1 tablet (4 mg total) by mouth every 4 (four) hours as needed for nausea or vomiting., Disp: 20 tablet, Rfl: 0   Probiotic Product (ALIGN) 4 MG CAPS, Take by mouth., Disp: , Rfl:    VITAMIN D PO, Take by mouth., Disp: , Rfl:   Review of Systems:  Negative unless indicated in HPI.   Physical Exam: Vitals:   10/22/23 0712  BP: 110/80  Pulse:  91  Temp: 98.7 F (37.1 C)  TempSrc: Oral  SpO2: 99%  Weight: 214 lb (97.1 kg)  Height: 5\' 7"  (1.702 m)    Body mass index is 33.52 kg/m.   Physical Exam Vitals reviewed.  Constitutional:      General: She is not in acute distress.    Appearance: Normal appearance. She is not ill-appearing, toxic-appearing or diaphoretic.  HENT:     Head: Normocephalic.     Right Ear: Tympanic membrane, ear canal and external ear normal. There is no impacted cerumen.     Left Ear: Tympanic membrane, ear canal and external ear normal. There is no impacted cerumen.     Nose: Nose normal.     Mouth/Throat:     Mouth: Mucous membranes are moist.     Pharynx: Oropharynx is clear. No oropharyngeal exudate or  posterior oropharyngeal erythema.  Eyes:     General: No scleral icterus.       Right eye: No discharge.        Left eye: No discharge.     Conjunctiva/sclera: Conjunctivae normal.     Pupils: Pupils are equal, round, and reactive to light.  Neck:     Vascular: No carotid bruit.  Cardiovascular:     Rate and Rhythm: Normal rate and regular rhythm.     Pulses: Normal pulses.     Heart sounds: Normal heart sounds.  Pulmonary:     Effort: Pulmonary effort is normal. No respiratory distress.     Breath sounds: Normal breath sounds.  Abdominal:     General: Abdomen is flat. Bowel sounds are normal.     Palpations: Abdomen is soft.  Musculoskeletal:        General: Normal range of motion.     Cervical back: Normal range of motion.  Skin:    General: Skin is warm and dry.  Neurological:     General: No focal deficit present.     Mental Status: She is alert and oriented to person, place, and time. Mental status is at baseline.  Psychiatric:        Mood and Affect: Mood normal.        Behavior: Behavior normal.        Thought Content: Thought content normal.        Judgment: Judgment normal.     Flowsheet Row Office Visit from 10/22/2023 in [facility] [place]  PHQ-9 Total Score 4        Impression and Plan:  Encounter for preventive health examination  Vitamin D deficiency -     VITAMIN D 25 Hydroxy (Vit-D Deficiency, Fractures); Future  Vitamin B12 deficiency -     Vitamin B12; Future  Hyperlipidemia, unspecified hyperlipidemia type -     CBC with Differential/Platelet; Future -     Comprehensive metabolic panel; Future -     Lipid panel; Future -     TSH; Future  Need for tetanus booster -     Tdap vaccine greater than or equal to 7yo IM  Immunization due  Encounter for hepatitis C screening test for low risk patient -     Hepatitis C antibody; Future  Encounter for screening for HIV -     HIV Antibody (routine testing w rflx);  Future   -Recommend routine eye and dental care. -Healthy lifestyle discussed in detail. -Labs to be updated today. -Prostate cancer screening: N/A Health Maintenance  Topic Date Due   HIV Screening  Never done  Hepatitis C Screening  Never done   Pap Smear  10/24/2016   Flu Shot  04/23/2023   COVID-19 Vaccine (4 - 2024-25 season) 05/24/2023   Mammogram  10/12/2024   Colon Cancer Screening  02/25/2032   DTaP/Tdap/Td vaccine (3 - Td or Tdap) 10/21/2033   HPV Vaccine  Aged Out    -Tdap administered in office today. -She had her flu vaccine in November. -Obtain records from GYN.    Chaya Jan, MD [facility]

## 2023-10-23 LAB — HEPATITIS C ANTIBODY: Hepatitis C Ab: NONREACTIVE

## 2023-10-23 LAB — HIV ANTIBODY (ROUTINE TESTING W REFLEX): HIV 1&2 Ab, 4th Generation: NONREACTIVE

## 2023-11-04 ENCOUNTER — Other Ambulatory Visit: Payer: Self-pay | Admitting: Internal Medicine

## 2023-11-04 ENCOUNTER — Encounter: Payer: Self-pay | Admitting: Internal Medicine

## 2023-11-04 DIAGNOSIS — E559 Vitamin D deficiency, unspecified: Secondary | ICD-10-CM

## 2023-11-04 DIAGNOSIS — E782 Mixed hyperlipidemia: Secondary | ICD-10-CM

## 2023-11-04 MED ORDER — VITAMIN D (ERGOCALCIFEROL) 1.25 MG (50000 UNIT) PO CAPS
50000.0000 [IU] | ORAL_CAPSULE | ORAL | 0 refills | Status: AC
Start: 2023-11-04 — End: 2024-01-21

## 2023-11-04 MED ORDER — ROSUVASTATIN CALCIUM 5 MG PO TABS: 5.0000 mg | ORAL_TABLET | Freq: Every day | ORAL | 1 refills | Status: DC

## 2023-11-29 ENCOUNTER — Encounter: Payer: Self-pay | Admitting: Internal Medicine

## 2023-12-05 ENCOUNTER — Encounter: Payer: Self-pay | Admitting: Internal Medicine

## 2024-01-20 ENCOUNTER — Other Ambulatory Visit: Payer: Self-pay | Admitting: Internal Medicine

## 2024-01-20 DIAGNOSIS — E559 Vitamin D deficiency, unspecified: Secondary | ICD-10-CM

## 2024-01-28 ENCOUNTER — Other Ambulatory Visit

## 2024-04-13 ENCOUNTER — Encounter: Payer: Self-pay | Admitting: Student

## 2024-04-13 ENCOUNTER — Ambulatory Visit (INDEPENDENT_AMBULATORY_CARE_PROVIDER_SITE_OTHER): Admitting: Nurse Practitioner

## 2024-04-13 VITALS — BP 122/70 | HR 96 | Ht 68.0 in | Wt 208.0 lb

## 2024-04-13 DIAGNOSIS — Z01419 Encounter for gynecological examination (general) (routine) without abnormal findings: Secondary | ICD-10-CM

## 2024-04-13 DIAGNOSIS — Z1331 Encounter for screening for depression: Secondary | ICD-10-CM

## 2024-04-13 NOTE — Patient Instructions (Signed)

## 2024-04-13 NOTE — Progress Notes (Signed)
   Linda Winters [birth year] [id number]   History:  48 y.o. [id number] presents for annual exam. S/P 2018 LAVH with bilateral salpingectomies for endometriosis. Normal pap and mammogram history.   Gynecologic History Patient's last menstrual period was 05/08/2017 (exact date).   Contraception/Family planning: status post hysterectomy Sexually active: Yes  Health Maintenance Last Pap: 10/25/2015. Results were: Normal Last mammogram: 10/13/2023. Results were: Normal Last colonoscopy: 02/24/2022. Results were: Benign polyps, 5-year recall Last Dexa: Not indicated     04/13/2024    4:03 PM  Depression screen PHQ 2/9  Decreased Interest 0  Down, Depressed, Hopeless 0  PHQ - 2 Score 0     Past medical history, past surgical history, family history and social history were all reviewed and documented in the EPIC chart. Married. Works for [employer]. Daughter and son.   ROS:  A ROS was performed and pertinent positives and negatives are included.  Exam:  Vitals:   04/13/24 1556  BP: 122/70  Pulse: 96  SpO2: 97%  Weight: 208 lb (94.3 kg)  Height: 5' 8 (1.727 m)     Body mass index is 31.63 kg/m.  General appearance:  Normal Thyroid :  Symmetrical, normal in size, without palpable masses or nodularity. Respiratory  Auscultation:  Clear without wheezing or rhonchi Cardiovascular  Auscultation:  Regular rate, without rubs, murmurs or gallops  Edema/varicosities:  Not grossly evident Abdominal  Soft,nontender, without masses, guarding or rebound.  Liver/spleen:  No organomegaly noted  Hernia:  None appreciated  Skin  Inspection:  Grossly normal Breasts: Examined lying and sitting.   Right: Without masses, retractions, nipple discharge or axillary adenopathy.   Left: Without masses, retractions, nipple discharge or axillary adenopathy. Pelvic: External genitalia:  no lesions              Urethra:  normal appearing urethra with no masses, tenderness or lesions               Bartholins and Skenes: normal                 Vagina: normal appearing vagina with normal color and discharge, no lesions              Cervix: absent Bimanual Exam:  Uterus:  absent              Adnexa: no mass, fullness, tenderness              Rectovaginal: Deferred              Anus:  normal, no lesions  Linda Winters, Linda Winters performed exam with supervision.   Assessment/Plan:  48 y.o. [id number] for annual exam.   Well female exam with routine gynecological exam - Education provided on SBEs, importance of preventative screenings, current guidelines, high calcium  diet, regular exercise, and multivitamin daily. Labs with PCP.   Screening for cervical cancer - Normal Pap history.  No longer screening per guidelines.  Screening for breast cancer - Normal mammogram history.  Continue annual screenings.  Normal breast exam today.  Screening for colon cancer - Colonoscopy in 2023. Will repeat at 5-year interval per GI's recommendation.   Return in about 1 year (around 04/13/2025) for Annual.    Annabella DELENA [facility], 4:22 PM 04/13/2024

## 2024-05-03 ENCOUNTER — Encounter: Payer: Self-pay | Admitting: Family Medicine

## 2024-05-03 ENCOUNTER — Ambulatory Visit: Admitting: Family Medicine

## 2024-05-03 VITALS — BP 130/82 | HR 110 | Temp 98.2°F | Wt 209.4 lb

## 2024-05-03 DIAGNOSIS — J029 Acute pharyngitis, unspecified: Secondary | ICD-10-CM

## 2024-05-03 DIAGNOSIS — J069 Acute upper respiratory infection, unspecified: Secondary | ICD-10-CM | POA: Diagnosis not present

## 2024-05-03 LAB — POC COVID19 BINAXNOW: SARS Coronavirus 2 Ag: NEGATIVE

## 2024-05-03 LAB — POCT RAPID STREP A (OFFICE): Rapid Strep A Screen: NEGATIVE

## 2024-05-03 NOTE — Progress Notes (Signed)
 Established Patient Office Visit  Subjective   Patient ID: Linda Winters, female    DOB: Jan 18, 1976  Age: 48 y.o. MRN: [id number]  No chief complaint on file.   HPI   [name] is seen as a work in today with 1 day history of sore throat, headache, postnasal drainage, nasal congestion.  Sore throat symptoms have been relatively mild.  No documented fever.  No significant myalgias.  She helps care for her 59 year old mother and 1 to make sure she did not have COVID or other obvious infection to expose her to.  She is generally very healthy.  Non-smoker.  No chronic heart or lung problems.  Past Medical History:  Diagnosis Date   Anxiety    Depression    Endometriosis    Hemoglobin C trait (HCC)    Hyperlipidemia    NSVD (normal spontaneous vaginal delivery)    X 2   Past Surgical History:  Procedure Laterality Date   CYSTOSCOPY N/A 05/26/2017   Procedure: CYSTOSCOPY;  Surgeon: Rockney Evalene SQUIBB, MD;  Location: [facility];  Service: Gynecology;  Laterality: N/A;   LAPAROSCOPIC VAGINAL HYSTERECTOMY WITH SALPINGECTOMY Bilateral 05/26/2017   Procedure: LAPAROSCOPIC ASSISTED VAGINAL HYSTERECTOMY WITH SALPINGECTOMY;  Surgeon: Rockney Evalene SQUIBB, MD;  Location: [facility];  Service: Gynecology;  Laterality: Bilateral;  requests 7:30am OR time  requests 2 1/2 hours   PELVIC LAPAROSCOPY     WISDOM TOOTH EXTRACTION      reports that she quit smoking about 13 years ago. Her smoking use included cigarettes. She has never used smokeless tobacco. She reports current alcohol use. She reports that she does not use drugs. family history includes Cancer in her maternal grandfather and maternal uncle; Colon polyps in her sister; Diabetes in her father; Esophageal cancer in her maternal uncle; Heart disease in her father; Hypertension in her father and mother. Allergies  Allergen Reactions   Compazine  [Prochlorperazine  Edisylate] Anxiety    Review of Systems  Constitutional:  Negative for chills and  fever.  HENT:  Positive for congestion and sore throat. Negative for ear pain.   Respiratory:  Positive for cough. Negative for wheezing.       Objective:     BP 130/82   Pulse (!) 110   Temp 98.2 F (36.8 C) (Oral)   Wt 209 lb 6.4 oz (95 kg)   LMP 05/08/2017 (Exact Date) Comment: GYN - [facility]  SpO2 96%   BMI 31.84 kg/m    Physical Exam Vitals reviewed.  Constitutional:      General: She is not in acute distress.    Appearance: She is not ill-appearing.  HENT:     Right Ear: Tympanic membrane normal.     Left Ear: Tympanic membrane normal.     Mouth/Throat:     Mouth: Mucous membranes are moist.     Pharynx: Oropharynx is clear. No oropharyngeal exudate or posterior oropharyngeal erythema.  Cardiovascular:     Rate and Rhythm: Regular rhythm.  Pulmonary:     Effort: Pulmonary effort is normal.     Breath sounds: Normal breath sounds. No wheezing or rales.  Musculoskeletal:     Cervical back: Neck supple.  Lymphadenopathy:     Cervical: No cervical adenopathy.  Neurological:     Mental Status: She is alert.      Results for orders placed or performed in visit on 05/03/24  POC Rapid Strep A  Result Value Ref Range   Rapid Strep A Screen Negative Negative  POC COVID-19 BinaxNow  Result Value Ref Range   SARS Coronavirus 2 Ag Negative Negative      The 10-year ASCVD risk score (Arnett DK, et al., 2019) is: 1.9%    Assessment & Plan:   Problem List Items Addressed This Visit   None Visit Diagnoses       Sore throat    -  Primary   Relevant Orders   POC Rapid Strep A (Completed)   POC COVID-19 BinaxNow (Completed)     Probable viral URI with cough.  Rapid strep and COVID testing negative.  Recommend plenty of fluids and rest.  Consider over-the-counter plain Mucinex.  Tylenol  or Advil for symptom relief.  Follow-up for any persistent or worsening symptoms.  No follow-ups on file.    Wolm Scarlet, MD

## 2024-06-24 ENCOUNTER — Other Ambulatory Visit: Payer: Self-pay | Admitting: Internal Medicine

## 2024-06-24 DIAGNOSIS — E782 Mixed hyperlipidemia: Secondary | ICD-10-CM

## 2024-09-19 ENCOUNTER — Other Ambulatory Visit: Payer: Self-pay | Admitting: Internal Medicine

## 2024-09-19 DIAGNOSIS — Z1231 Encounter for screening mammogram for malignant neoplasm of breast: Secondary | ICD-10-CM

## 2024-10-12 ENCOUNTER — Other Ambulatory Visit: Payer: Self-pay | Admitting: Medical Genetics

## 2024-10-14 ENCOUNTER — Ambulatory Visit
Admission: RE | Admit: 2024-10-14 | Discharge: 2024-10-14 | Disposition: A | Source: Ambulatory Visit | Attending: Internal Medicine | Admitting: Internal Medicine

## 2024-10-14 DIAGNOSIS — Z1231 Encounter for screening mammogram for malignant neoplasm of breast: Secondary | ICD-10-CM

## 2025-01-05 ENCOUNTER — Encounter: Admitting: Internal Medicine

## 2025-02-02 ENCOUNTER — Encounter: Admitting: Internal Medicine
